# Patient Record
Sex: Male | Born: 1970
Health system: Southern US, Community
[De-identification: ages and names within clinical notes are randomized; demographics above are authoritative.]

## PROBLEM LIST (undated history)

## (undated) DIAGNOSIS — Z8669 Personal history of other diseases of the nervous system and sense organs: Secondary | ICD-10-CM

## (undated) DIAGNOSIS — C61 Malignant neoplasm of prostate: Secondary | ICD-10-CM

## (undated) DIAGNOSIS — F419 Anxiety disorder, unspecified: Secondary | ICD-10-CM

## (undated) DIAGNOSIS — H52209 Unspecified astigmatism, unspecified eye: Secondary | ICD-10-CM

## (undated) HISTORY — PX: FOREIGN BODY REMOVAL: SHX962

## (undated) HISTORY — PX: VASECTOMY: SHX75

## (undated) HISTORY — PX: KNEE ARTHROSCOPY: SUR90

## (undated) HISTORY — PX: HERNIA REPAIR: SHX51

---

## 2005-11-26 ENCOUNTER — Emergency Department (HOSPITAL_COMMUNITY): Admission: EM | Admit: 2005-11-26 | Discharge: 2005-11-26 | Payer: Self-pay | Admitting: Emergency Medicine

## 2009-11-17 ENCOUNTER — Emergency Department (HOSPITAL_COMMUNITY): Admission: EM | Admit: 2009-11-17 | Discharge: 2009-11-17 | Payer: Self-pay | Admitting: Emergency Medicine

## 2011-06-18 ENCOUNTER — Telehealth: Payer: Self-pay | Admitting: Cardiology

## 2011-06-18 NOTE — Telephone Encounter (Signed)
Error

## 2013-09-15 ENCOUNTER — Emergency Department (HOSPITAL_COMMUNITY): Payer: BC Managed Care – PPO

## 2013-09-15 ENCOUNTER — Encounter (HOSPITAL_COMMUNITY): Payer: Self-pay | Admitting: Emergency Medicine

## 2013-09-15 ENCOUNTER — Emergency Department (HOSPITAL_COMMUNITY)
Admission: EM | Admit: 2013-09-15 | Discharge: 2013-09-15 | Disposition: A | Discharge status: Home / Self Care | Payer: BC Managed Care – PPO | Attending: Emergency Medicine | Admitting: Emergency Medicine

## 2013-09-15 DIAGNOSIS — Y9389 Activity, other specified: Secondary | ICD-10-CM | POA: Insufficient documentation

## 2013-09-15 DIAGNOSIS — Y929 Unspecified place or not applicable: Secondary | ICD-10-CM | POA: Insufficient documentation

## 2013-09-15 DIAGNOSIS — S91331A Puncture wound without foreign body, right foot, initial encounter: Secondary | ICD-10-CM

## 2013-09-15 DIAGNOSIS — W3400XA Accidental discharge from unspecified firearms or gun, initial encounter: Secondary | ICD-10-CM

## 2013-09-15 DIAGNOSIS — Z23 Encounter for immunization: Secondary | ICD-10-CM | POA: Insufficient documentation

## 2013-09-15 DIAGNOSIS — W34010A Accidental discharge of airgun, initial encounter: Secondary | ICD-10-CM | POA: Insufficient documentation

## 2013-09-15 DIAGNOSIS — M795 Residual foreign body in soft tissue: Secondary | ICD-10-CM

## 2013-09-15 DIAGNOSIS — S91309A Unspecified open wound, unspecified foot, initial encounter: Secondary | ICD-10-CM | POA: Insufficient documentation

## 2013-09-15 MED ORDER — HYDROCODONE-ACETAMINOPHEN 5-325 MG PO TABS: 1.0000 | ORAL_TABLET | ORAL | Status: DC | PRN

## 2013-09-15 MED ORDER — TETANUS-DIPHTH-ACELL PERTUSSIS 5-2.5-18.5 LF-MCG/0.5 IM SUSP
0.5000 mL | Freq: Once | INTRAMUSCULAR | Status: AC
  Administered 2013-09-15: 0.5 mL via INTRAMUSCULAR
  Filled 2013-09-15: qty 0.5

## 2013-09-15 MED ORDER — CEPHALEXIN 500 MG PO CAPS: 500.0000 mg | ORAL_CAPSULE | Freq: Four times a day (QID) | ORAL | Status: DC

## 2013-09-15 MED ORDER — ONDANSETRON 4 MG PO TBDP
4.0000 mg | ORAL_TABLET | Freq: Once | ORAL | Status: AC
  Administered 2013-09-15: 4 mg via ORAL
  Filled 2013-09-15: qty 1
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
## 2013-09-15 NOTE — ED Notes (Signed)
Ortho tech called for application of ACE wrap and post op shoe.

## 2013-09-15 NOTE — ED Notes (Signed)
Pt ambulatory to exam room with steady gait.  
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
## 2013-09-15 NOTE — ED Provider Notes (Signed)
Medical screening examination/treatment/procedure(s) were performed by non-physician practitioner and as supervising physician I was immediately available for consultation/collaboration.  EKG Interpretation   None         Mckinsley Koelzer, MD 09/15/13 1929 

## 2018-05-05 ENCOUNTER — Other Ambulatory Visit: Payer: Self-pay | Admitting: Occupational Medicine

## 2018-05-05 ENCOUNTER — Ambulatory Visit: Payer: Self-pay

## 2018-05-05 DIAGNOSIS — S51832A Puncture wound without foreign body of left forearm, initial encounter: Secondary | ICD-10-CM

## 2019-01-13 DIAGNOSIS — Z0000 Encounter for general adult medical examination without abnormal findings: Secondary | ICD-10-CM | POA: Diagnosis not present

## 2019-01-13 DIAGNOSIS — Z125 Encounter for screening for malignant neoplasm of prostate: Secondary | ICD-10-CM | POA: Diagnosis not present

## 2019-01-13 DIAGNOSIS — Z131 Encounter for screening for diabetes mellitus: Secondary | ICD-10-CM | POA: Diagnosis not present

## 2019-03-22 ENCOUNTER — Other Ambulatory Visit: Payer: Self-pay | Admitting: Urology
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
## 2019-04-13 ENCOUNTER — Other Ambulatory Visit: Payer: Self-pay

## 2019-04-13 ENCOUNTER — Encounter (HOSPITAL_COMMUNITY): Payer: Self-pay

## 2019-04-13 ENCOUNTER — Encounter (HOSPITAL_COMMUNITY)
Admission: RE | Admit: 2019-04-13 | Discharge: 2019-04-13 | Disposition: A | Discharge status: Home / Self Care | Payer: 59 | Source: Ambulatory Visit | Attending: Urology | Admitting: Urology

## 2019-04-13 DIAGNOSIS — Z20828 Contact with and (suspected) exposure to other viral communicable diseases: Secondary | ICD-10-CM | POA: Diagnosis not present

## 2019-04-13 DIAGNOSIS — Z01812 Encounter for preprocedural laboratory examination: Secondary | ICD-10-CM | POA: Diagnosis not present

## 2019-04-13 HISTORY — DX: Personal history of other diseases of the nervous system and sense organs: Z86.69

## 2019-04-13 HISTORY — DX: Anxiety disorder, unspecified: F41.9

## 2019-04-13 HISTORY — DX: Unspecified astigmatism, unspecified eye: H52.209

## 2019-04-13 HISTORY — DX: Malignant neoplasm of prostate: C61

## 2019-04-13 LAB — CBC
HCT: 40.4 % (ref 39.0–52.0)
Hemoglobin: 13.2 g/dL (ref 13.0–17.0)
MCH: 29.3 pg (ref 26.0–34.0)
MCHC: 32.7 g/dL (ref 30.0–36.0)
MCV: 89.8 fL (ref 80.0–100.0)
Platelets: 208 10*3/uL (ref 150–400)
RBC: 4.5 MIL/uL (ref 4.22–5.81)
RDW: 13.9 % (ref 11.5–15.5)
WBC: 4.6 10*3/uL (ref 4.0–10.5)
nRBC: 0 % (ref 0.0–0.2)

## 2019-04-13 LAB — BASIC METABOLIC PANEL
Anion gap: 6 (ref 5–15)
BUN: 12 mg/dL (ref 6–20)
CO2: 28 mmol/L (ref 22–32)
Calcium: 9.2 mg/dL (ref 8.9–10.3)
Chloride: 105 mmol/L (ref 98–111)
Creatinine, Ser: 1.2 mg/dL (ref 0.61–1.24)
GFR calc Af Amer: >60 mL/min (ref >60)
GFR calc non Af Amer: >60 mL/min (ref >60)
Glucose, Bld: 103 mg/dL — ABNORMAL HIGH (ref 70–99)
Potassium: 3.8 mmol/L (ref 3.5–5.1)
Sodium: 139 mmol/L (ref 135–145)

## 2019-04-13 LAB — PROTIME-INR
INR: 0.9 (ref 0.8–1.2)
Prothrombin Time: 12.3 seconds (ref 11.4–15.2)

## 2019-04-13 NOTE — Patient Instructions (Addendum)
YOU NEED TO HAVE A COVID 19 TEST ON_______ Thursday July 30th @_3 :00pm______, THIS TEST MUST BE DONE BEFORE SURGERY, COM[address]  [address], [place] , [address]. ONCE YOUR COVID TEST IS COMPLETED, PLEASE BEGIN THE QUARANTINE INSTRUCTIONS AS OUTLINED IN YOUR HANDOUT.                 Bradley Lowe      Your procedure is scheduled on: 04-19-2019   Report to [facility] Main  Entrance    Report to [place] at 5:30AM   1 VISITOR IS ALLOWED TO WAIT IN WAITING ROOM  ONLY DAY OF YOUR SURGERY.    Call this number if you have problems the morning of surgery [phone]    Remember: Do not eat food or drink liquids :After Midnight. BRUSH YOUR TEETH MORNING OF SURGERY AND RINSE YOUR MOUTH OUT, NO CHEWING GUM CANDY OR MINTS.     Take these medicines the morning of surgery with A SIP OF WATER: TYLENOL IF NEEDED, GABAPENTIN IF NEEDED, HYDROXYZINE IF NEEDED               You may not have any metal on your body including hair pins and              piercings  Do not wear jewelry, make-up, lotions, powders or perfumes, deodorant             Do not wear nail polish.  Do not shave  48 hours prior to surgery.              Men may shave face and neck.   Do not bring valuables to the hospital. [place].  Contacts, dentures or bridgework may not be worn into surgery.                    Please read over the following fact sheets you were given: _____________________________________________________________________             [facility] - Preparing for Surgery Before surgery, you can play an important role.  Because skin is not sterile, your skin needs to be as free of germs as possible.  You can reduce the number of germs on your skin by washing with CHG (chlorahexidine gluconate) soap before surgery.  CHG is an antiseptic cleaner which kills germs and bonds with the skin to continue killing germs even after washing. Please DO  NOT use if you have an allergy to CHG or antibacterial soaps.  If your skin becomes reddened/irritated stop using the CHG and inform your nurse when you arrive at Short Stay. Do not shave (including legs and underarms) for at least 48 hours prior to the first CHG shower.  You may shave your face/neck. Please follow these instructions carefully:  1.  Shower with CHG Soap the night before surgery and the  morning of Surgery.  2.  If you choose to wash your hair, wash your hair first as usual with your  normal  shampoo.  3.  After you shampoo, rinse your hair and body thoroughly to remove the  shampoo.                           4.  Use CHG as you would any other liquid soap.  You can apply chg directly  to the  skin and wash                       Gently with a scrungie or clean washcloth.  5.  Apply the CHG Soap to your body ONLY FROM THE NECK DOWN.   Do not use on face/ open                           Wound or open sores. Avoid contact with eyes, ears mouth and genitals (private parts).                       Wash face,  Genitals (private parts) with your normal soap.             6.  Wash thoroughly, paying special attention to the area where your surgery  will be performed.  7.  Thoroughly rinse your body with warm water from the neck down.  8.  DO NOT shower/wash with your normal soap after using and rinsing off  the CHG Soap.                9.  Pat yourself dry with a clean towel.            10.  Wear clean pajamas.            11.  Place clean sheets on your bed the night of your first shower and do not  sleep with pets. Day of Surgery : Do not apply any lotions/deodorants the morning of surgery.  Please wear clean clothes to the hospital/surgery center.  FAILURE TO FOLLOW THESE INSTRUCTIONS MAY RESULT IN THE CANCELLATION OF YOUR SURGERY PATIENT SIGNATURE_________________________________  NURSE  SIGNATURE__________________________________  ________________________________________________________________________   Bradley Lowe  An incentive spirometer is a tool that can help keep your lungs clear and active. This tool measures how well you are filling your lungs with each breath. Taking long deep breaths may help reverse or decrease the chance of developing breathing (pulmonary) problems (especially infection) following:  A long period of time when you are unable to move or be active. BEFORE THE PROCEDURE   If the spirometer includes an indicator to show your best effort, your nurse or respiratory therapist will set it to a desired goal.  If possible, sit up straight or lean slightly forward. Try not to slouch.  Hold the incentive spirometer in an upright position. INSTRUCTIONS FOR USE  1. Sit on the edge of your bed if possible, or sit up as far as you can in bed or on a chair. 2. Hold the incentive spirometer in an upright position. 3. Breathe out normally. 4. Place the mouthpiece in your mouth and seal your lips tightly around it. 5. Breathe in slowly and as deeply as possible, raising the piston or the ball toward the top of the column. 6. Hold your breath for 3-5 seconds or for as long as possible. Allow the piston or ball to fall to the bottom of the column. 7. Remove the mouthpiece from your mouth and breathe out normally. 8. Rest for a few seconds and repeat Steps 1 through 7 at least 10 times every 1-2 hours when you are awake. Take your time and take a few normal breaths between deep breaths. 9. The spirometer may include an indicator to show your best effort. Use the indicator as a goal to work toward during each repetition. 10. After each set of  10 deep breaths, practice coughing to be sure your lungs are clear. If you have an incision (the cut made at the time of surgery), support your incision when coughing by placing a pillow or rolled up towels firmly  against it. Once you are able to get out of bed, walk around indoors and cough well. You may stop using the incentive spirometer when instructed by your caregiver.  RISKS AND COMPLICATIONS  Take your time so you do not get dizzy or light-headed.  If you are in pain, you may need to take or ask for pain medication before doing incentive spirometry. It is harder to take a deep breath if you are having pain. AFTER USE  Rest and breathe slowly and easily.  It can be helpful to keep track of a log of your progress. Your caregiver can provide you with a simple table to help with this. If you are using the spirometer at home, follow these instructions: [place] IF:   You are having difficultly using the spirometer.  You have trouble using the spirometer as often as instructed.  Your pain medication is not giving enough relief while using the spirometer.  You develop fever of 100.5 F (38.1 C) or higher. SEEK IMMEDIATE MEDICAL CARE IF:   You cough up bloody sputum that had not been present before.  You develop fever of 102 F (38.9 C) or greater.  You develop worsening pain at or near the incision site. MAKE SURE YOU:   Understand these instructions.  Will watch your condition.  Will get help right away if you are not doing well or get worse. Document Released: 01/13/2007 Document Revised: 11/25/2011 Document Reviewed: 03/16/2007 Exit[employer] Patient Information 2014 ExitCare, [place].   ________________________________________________________________________  WHAT IS A BLOOD TRANSFUSION? Blood Transfusion Information  A transfusion is the replacement of blood or some of its parts. Blood is made up of multiple cells which provide different functions.  Red blood cells carry oxygen and are used for blood loss replacement.  White blood cells fight against infection.  Platelets control bleeding.  Plasma helps clot blood.  Other blood products are available for  specialized needs, such as hemophilia or other clotting disorders. BEFORE THE TRANSFUSION  Who gives blood for transfusions?   Healthy volunteers who are fully evaluated to make sure their blood is safe. This is blood bank blood. Transfusion therapy is the safest it has ever been in the practice of medicine. Before blood is taken from a donor, a complete history is taken to make sure that person has no history of diseases nor engages in risky social behavior (examples are intravenous drug use or sexual activity with multiple partners). The donor's travel history is screened to minimize risk of transmitting infections, such as malaria. The donated blood is tested for signs of infectious diseases, such as HIV and hepatitis. The blood is then tested to be sure it is compatible with you in order to minimize the chance of a transfusion reaction. If you or a relative donates blood, this is often done in anticipation of surgery and is not appropriate for emergency situations. It takes many days to process the donated blood. RISKS AND COMPLICATIONS Although transfusion therapy is very safe and saves many lives, the main dangers of transfusion include:   Getting an infectious disease.  Developing a transfusion reaction. This is an allergic reaction to something in the blood you were given. Every precaution is taken to prevent this. The decision to have a blood  transfusion has been considered carefully by your caregiver before blood is given. Blood is not given unless the benefits outweigh the risks. AFTER THE TRANSFUSION  Right after receiving a blood transfusion, you will usually feel much better and more energetic. This is especially true if your red blood cells have gotten low (anemic). The transfusion raises the level of the red blood cells which carry oxygen, and this usually causes an energy increase.  The nurse administering the transfusion will monitor you carefully for complications. HOME CARE  INSTRUCTIONS  No special instructions are needed after a transfusion. You may find your energy is better. Speak with your caregiver about any limitations on activity for underlying diseases you may have. SEEK MEDICAL CARE IF:   Your condition is not improving after your transfusion.  You develop redness or irritation at the intravenous (IV) site. SEEK IMMEDIATE MEDICAL CARE IF:  Any of the following symptoms occur over the next 12 hours:  Shaking chills.  You have a temperature by mouth above 102 F (38.9 C), not controlled by medicine.  Chest, back, or muscle pain.  People around you feel you are not acting correctly or are confused.  Shortness of breath or difficulty breathing.  Dizziness and fainting.  You get a rash or develop hives.  You have a decrease in urine output.  Your urine turns a dark color or changes to pink, red, or brown. Any of the following symptoms occur over the next 10 days:  You have a temperature by mouth above 102 F (38.9 C), not controlled by medicine.  Shortness of breath.  Weakness after normal activity.  The white part of the eye turns yellow (jaundice).  You have a decrease in the amount of urine or are urinating less often.  Your urine turns a dark color or changes to pink, red, or brown. Document Released: 08/30/2000 Document Revised: 11/25/2011 Document Reviewed: 04/18/2008 [facility] Patient Information 2014 [place], [place].  _______________________________________________________________________

## 2019-04-14 LAB — ABO/RH: ABO/RH(D): O POS

## 2019-04-15 ENCOUNTER — Other Ambulatory Visit (HOSPITAL_COMMUNITY)
Admission: RE | Admit: 2019-04-15 | Discharge: 2019-04-15 | Disposition: A | Discharge status: Home / Self Care | Payer: 59 | Source: Ambulatory Visit | Attending: Urology | Admitting: Urology

## 2019-04-15 DIAGNOSIS — Z01812 Encounter for preprocedural laboratory examination: Secondary | ICD-10-CM | POA: Diagnosis not present

## 2019-04-16 LAB — SARS CORONAVIRUS 2 (TAT 6-24 HRS): SARS Coronavirus 2: NEGATIVE

## 2019-04-19 ENCOUNTER — Other Ambulatory Visit: Payer: Self-pay

## 2019-04-19 ENCOUNTER — Observation Stay (HOSPITAL_COMMUNITY)
Admission: RE | Admit: 2019-04-19 | Discharge: 2019-04-20 | Disposition: A | Discharge status: Home / Self Care | Payer: 59 | Source: Ambulatory Visit | Attending: Urology | Admitting: Urology

## 2019-04-19 ENCOUNTER — Encounter (HOSPITAL_COMMUNITY): Payer: Self-pay | Admitting: *Deleted

## 2019-04-19 ENCOUNTER — Ambulatory Visit (HOSPITAL_COMMUNITY): Payer: 59 | Admitting: Anesthesiology

## 2019-04-19 ENCOUNTER — Ambulatory Visit (HOSPITAL_COMMUNITY): Payer: 59 | Admitting: Physician Assistant

## 2019-04-19 ENCOUNTER — Encounter (HOSPITAL_COMMUNITY)
Admission: RE | Disposition: A | Discharge status: Home / Self Care | Payer: Self-pay | Source: Ambulatory Visit | Attending: Urology

## 2019-04-19 DIAGNOSIS — G4730 Sleep apnea, unspecified: Secondary | ICD-10-CM | POA: Diagnosis not present

## 2019-04-19 DIAGNOSIS — F419 Anxiety disorder, unspecified: Secondary | ICD-10-CM | POA: Insufficient documentation

## 2019-04-19 DIAGNOSIS — Z79899 Other long term (current) drug therapy: Secondary | ICD-10-CM | POA: Diagnosis not present

## 2019-04-19 DIAGNOSIS — C61 Malignant neoplasm of prostate: Principal | ICD-10-CM | POA: Diagnosis present

## 2019-04-19 DIAGNOSIS — Z791 Long term (current) use of non-steroidal anti-inflammatories (NSAID): Secondary | ICD-10-CM | POA: Insufficient documentation

## 2019-04-19 HISTORY — PX: PELVIC LYMPH NODE DISSECTION: SHX6543

## 2019-04-19 HISTORY — PX: ROBOT ASSISTED LAPAROSCOPIC RADICAL PROSTATECTOMY: SHX5141

## 2019-04-19 LAB — HEMOGLOBIN AND HEMATOCRIT, BLOOD
HCT: 39 % (ref 39.0–52.0)
Hemoglobin: 13.4 g/dL (ref 13.0–17.0)

## 2019-04-19 LAB — TYPE AND SCREEN
ABO/RH(D): O POS
Antibody Screen: NEGATIVE

## 2019-04-19 SURGERY — PROSTATECTOMY, RADICAL, ROBOT-ASSISTED, LAPAROSCOPIC
Anesthesia: General | Site: Abdomen

## 2019-04-19 MED ORDER — GABAPENTIN 100 MG PO CAPS: 100.0000 mg | ORAL_CAPSULE | Freq: Every day | ORAL | Status: DC | PRN

## 2019-04-19 MED ORDER — ROCURONIUM BROMIDE 10 MG/ML (PF) SYRINGE
PREFILLED_SYRINGE | INTRAVENOUS | Status: AC
  Filled 2019-04-19: qty 10

## 2019-04-19 MED ORDER — FENTANYL CITRATE (PF) 250 MCG/5ML IJ SOLN
INTRAMUSCULAR | Status: AC
  Filled 2019-04-19: qty 5

## 2019-04-19 MED ORDER — MIDAZOLAM HCL 5 MG/5ML IJ SOLN
INTRAMUSCULAR | Status: DC | PRN
  Administered 2019-04-19: 2 mg via INTRAVENOUS

## 2019-04-19 MED ORDER — ONDANSETRON HCL 4 MG/2ML IJ SOLN: 4.0000 mg | INTRAMUSCULAR | Status: DC | PRN

## 2019-04-19 MED ORDER — MIDAZOLAM HCL 2 MG/2ML IJ SOLN
INTRAMUSCULAR | Status: AC
  Filled 2019-04-19: qty 2

## 2019-04-19 MED ORDER — DIPHENHYDRAMINE HCL 12.5 MG/5ML PO ELIX: 12.5000 mg | ORAL_SOLUTION | Freq: Four times a day (QID) | ORAL | Status: DC | PRN

## 2019-04-19 MED ORDER — HYDROMORPHONE HCL 1 MG/ML IJ SOLN
0.2500 mg | INTRAMUSCULAR | Status: DC | PRN
  Administered 2019-04-19 (×4): 0.5 mg via INTRAVENOUS

## 2019-04-19 MED ORDER — ACETAMINOPHEN 325 MG PO TABS
650.0000 mg | ORAL_TABLET | ORAL | Status: DC | PRN
  Administered 2019-04-20 (×2): 650 mg via ORAL
  Filled 2019-04-19 (×2): qty 2

## 2019-04-19 MED ORDER — LIDOCAINE 2% (20 MG/ML) 5 ML SYRINGE
INTRAMUSCULAR | Status: DC | PRN
  Administered 2019-04-19: 100 mg via INTRAVENOUS

## 2019-04-19 MED ORDER — SODIUM CHLORIDE (PF) 0.9 % IJ SOLN
INTRAMUSCULAR | Status: DC | PRN
  Administered 2019-04-19: 20 mL

## 2019-04-19 MED ORDER — DOCUSATE SODIUM 100 MG PO CAPS
100.0000 mg | ORAL_CAPSULE | Freq: Two times a day (BID) | ORAL | Status: DC
  Administered 2019-04-19 – 2019-04-20 (×2): 100 mg via ORAL
  Filled 2019-04-19 (×2): qty 1

## 2019-04-19 MED ORDER — HYDROMORPHONE HCL 1 MG/ML IJ SOLN
INTRAMUSCULAR | Status: AC
  Filled 2019-04-19: qty 1

## 2019-04-19 MED ORDER — SUCCINYLCHOLINE CHLORIDE 200 MG/10ML IV SOSY
PREFILLED_SYRINGE | INTRAVENOUS | Status: AC
  Filled 2019-04-19: qty 10

## 2019-04-19 MED ORDER — HYDROCODONE-ACETAMINOPHEN 5-325 MG PO TABS: 1.0000 | ORAL_TABLET | Freq: Four times a day (QID) | ORAL | 0 refills | Status: DC | PRN

## 2019-04-19 MED ORDER — DEXAMETHASONE SODIUM PHOSPHATE 10 MG/ML IJ SOLN
INTRAMUSCULAR | Status: AC
  Filled 2019-04-19: qty 1

## 2019-04-19 MED ORDER — LACTATED RINGERS IR SOLN
Status: DC | PRN
  Administered 2019-04-19: 1000 mL

## 2019-04-19 MED ORDER — DEXAMETHASONE SODIUM PHOSPHATE 10 MG/ML IJ SOLN
INTRAMUSCULAR | Status: DC | PRN
  Administered 2019-04-19: 5 mg via INTRAVENOUS

## 2019-04-19 MED ORDER — ONDANSETRON HCL 4 MG/2ML IJ SOLN
INTRAMUSCULAR | Status: DC | PRN
  Administered 2019-04-19: 4 mg via INTRAVENOUS

## 2019-04-19 MED ORDER — SODIUM CHLORIDE (PF) 0.9 % IJ SOLN
INTRAMUSCULAR | Status: AC
  Filled 2019-04-19: qty 20

## 2019-04-19 MED ORDER — FENTANYL CITRATE (PF) 250 MCG/5ML IJ SOLN
INTRAMUSCULAR | Status: DC | PRN
  Administered 2019-04-19 (×2): 50 ug via INTRAVENOUS
  Administered 2019-04-19: 100 ug via INTRAVENOUS
  Administered 2019-04-19 (×3): 50 ug via INTRAVENOUS

## 2019-04-19 MED ORDER — SUCCINYLCHOLINE CHLORIDE 200 MG/10ML IV SOSY
PREFILLED_SYRINGE | INTRAVENOUS | Status: DC | PRN
  Administered 2019-04-19: 120 mg via INTRAVENOUS

## 2019-04-19 MED ORDER — SULFAMETHOXAZOLE-TRIMETHOPRIM 800-160 MG PO TABS: 1.0000 | ORAL_TABLET | Freq: Two times a day (BID) | ORAL | 0 refills | Status: DC

## 2019-04-19 MED ORDER — BACITRACIN-NEOMYCIN-POLYMYXIN 400-5-5000 EX OINT: 1.0000 "application " | TOPICAL_OINTMENT | Freq: Three times a day (TID) | CUTANEOUS | Status: DC | PRN

## 2019-04-19 MED ORDER — LIDOCAINE 2% (20 MG/ML) 5 ML SYRINGE
INTRAMUSCULAR | Status: AC
  Filled 2019-04-19: qty 5

## 2019-04-19 MED ORDER — PHENYLEPHRINE 40 MCG/ML (10ML) SYRINGE FOR IV PUSH (FOR BLOOD PRESSURE SUPPORT)
PREFILLED_SYRINGE | INTRAVENOUS | Status: AC
  Filled 2019-04-19: qty 10

## 2019-04-19 MED ORDER — FENTANYL CITRATE (PF) 100 MCG/2ML IJ SOLN
INTRAMUSCULAR | Status: AC
  Filled 2019-04-19: qty 2

## 2019-04-19 MED ORDER — HYDROMORPHONE HCL 1 MG/ML IJ SOLN
INTRAMUSCULAR | Status: AC
  Administered 2019-04-19: 0.5 mg via INTRAVENOUS
  Filled 2019-04-19: qty 1

## 2019-04-19 MED ORDER — ONDANSETRON HCL 4 MG/2ML IJ SOLN
INTRAMUSCULAR | Status: AC
  Filled 2019-04-19: qty 2

## 2019-04-19 MED ORDER — PROMETHAZINE HCL 25 MG/ML IJ SOLN: 6.2500 mg | INTRAMUSCULAR | Status: DC | PRN

## 2019-04-19 MED ORDER — EPHEDRINE 5 MG/ML INJ
INTRAVENOUS | Status: AC
  Filled 2019-04-19: qty 10

## 2019-04-19 MED ORDER — PROPOFOL 10 MG/ML IV BOLUS
INTRAVENOUS | Status: DC | PRN
  Administered 2019-04-19: 160 mg via INTRAVENOUS

## 2019-04-19 MED ORDER — HYDROMORPHONE HCL 1 MG/ML IJ SOLN
0.5000 mg | INTRAMUSCULAR | Status: DC | PRN
  Administered 2019-04-19 (×2): 1 mg via INTRAVENOUS
  Filled 2019-04-19: qty 1

## 2019-04-19 MED ORDER — SUGAMMADEX SODIUM 200 MG/2ML IV SOLN
INTRAVENOUS | Status: DC | PRN
  Administered 2019-04-19: 200 mg via INTRAVENOUS

## 2019-04-19 MED ORDER — HYDROXYZINE HCL 10 MG PO TABS: 10.0000 mg | ORAL_TABLET | Freq: Every day | ORAL | Status: DC | PRN

## 2019-04-19 MED ORDER — DIPHENHYDRAMINE HCL 50 MG/ML IJ SOLN: 12.5000 mg | Freq: Four times a day (QID) | INTRAMUSCULAR | Status: DC | PRN

## 2019-04-19 MED ORDER — OXYBUTYNIN CHLORIDE 5 MG PO TABS: 5.0000 mg | ORAL_TABLET | Freq: Three times a day (TID) | ORAL | Status: DC | PRN

## 2019-04-19 MED ORDER — DIAZEPAM 5 MG PO TABS: 5.0000 mg | ORAL_TABLET | Freq: Three times a day (TID) | ORAL | Status: DC | PRN

## 2019-04-19 MED ORDER — ROCURONIUM BROMIDE 10 MG/ML (PF) SYRINGE
PREFILLED_SYRINGE | INTRAVENOUS | Status: DC | PRN
  Administered 2019-04-19: 20 mg via INTRAVENOUS
  Administered 2019-04-19: 60 mg via INTRAVENOUS
  Administered 2019-04-19 (×4): 20 mg via INTRAVENOUS

## 2019-04-19 MED ORDER — SODIUM CHLORIDE 0.9 % IV BOLUS
1000.0000 mL | Freq: Once | INTRAVENOUS | Status: AC
  Administered 2019-04-19: 1000 mL via INTRAVENOUS

## 2019-04-19 MED ORDER — PROPOFOL 10 MG/ML IV BOLUS
INTRAVENOUS | Status: AC
  Filled 2019-04-19: qty 20

## 2019-04-19 MED ORDER — PHENYLEPHRINE 40 MCG/ML (10ML) SYRINGE FOR IV PUSH (FOR BLOOD PRESSURE SUPPORT)
PREFILLED_SYRINGE | INTRAVENOUS | Status: DC | PRN
  Administered 2019-04-19 (×2): 80 ug via INTRAVENOUS

## 2019-04-19 MED ORDER — BELLADONNA ALKALOIDS-OPIUM 16.2-60 MG RE SUPP
1.0000 | Freq: Four times a day (QID) | RECTAL | Status: DC | PRN
  Administered 2019-04-19: 1 via RECTAL

## 2019-04-19 MED ORDER — HYDROMORPHONE HCL 1 MG/ML IJ SOLN
INTRAMUSCULAR | Status: AC
  Administered 2019-04-19: 1 mg via INTRAVENOUS
  Filled 2019-04-19: qty 1

## 2019-04-19 MED ORDER — DEXTROSE-NACL 5-0.45 % IV SOLN
INTRAVENOUS | Status: DC
  Administered 2019-04-19 – 2019-04-20 (×2): via INTRAVENOUS

## 2019-04-19 MED ORDER — LACTATED RINGERS IV SOLN
INTRAVENOUS | Status: DC
  Administered 2019-04-19 (×2): via INTRAVENOUS

## 2019-04-19 MED ORDER — HYDROCODONE-ACETAMINOPHEN 5-325 MG PO TABS
1.0000 | ORAL_TABLET | ORAL | Status: DC | PRN
  Administered 2019-04-19 (×2): 2 via ORAL
  Filled 2019-04-19 (×2): qty 2

## 2019-04-19 MED ORDER — CEFAZOLIN SODIUM-DEXTROSE 2-4 GM/100ML-% IV SOLN
2.0000 g | INTRAVENOUS | Status: AC
  Administered 2019-04-19: 2 g via INTRAVENOUS
  Filled 2019-04-19: qty 100

## 2019-04-19 MED ORDER — BELLADONNA ALKALOIDS-OPIUM 16.2-60 MG RE SUPP
RECTAL | Status: AC
  Administered 2019-04-19: 1 via RECTAL
  Filled 2019-04-19: qty 1

## 2019-04-19 MED ORDER — BUPIVACAINE LIPOSOME 1.3 % IJ SUSP
20.0000 mL | Freq: Once | INTRAMUSCULAR | Status: AC
  Administered 2019-04-19: 20 mL
  Filled 2019-04-19: qty 20

## 2019-04-19 SURGICAL SUPPLY — 67 items
ADH SKN CLS APL DERMABOND .7 (GAUZE/BANDAGES/DRESSINGS) ×2
AGENT HMST KT MTR STRL THRMB (HEMOSTASIS) ×1
APL PRP STRL LF DISP 70% ISPRP (MISCELLANEOUS) ×2
APL SWBSTK 6 STRL LF DISP (MISCELLANEOUS) ×2
APPLICATOR COTTON TIP 6 STRL (MISCELLANEOUS) ×2 IMPLANT
APPLICATOR COTTON TIP 6IN STRL (MISCELLANEOUS) ×4
APPLICATOR SURGIFLO ENDO (HEMOSTASIS) ×2 IMPLANT
CATH FOLEY 2WAY SLVR  5CC 18FR (CATHETERS) ×2
CATH FOLEY 2WAY SLVR 5CC 18FR (CATHETERS) ×2 IMPLANT
CATH TIEMANN FOLEY 18FR 5CC (CATHETERS) ×4 IMPLANT
CHLORAPREP W/TINT 26 (MISCELLANEOUS) ×4 IMPLANT
CLIP VESOLOCK LG 6/CT PURPLE (CLIP) ×8 IMPLANT
COVER SURGICAL LIGHT HANDLE (MISCELLANEOUS) ×4 IMPLANT
COVER TIP SHEARS 8 DVNC (MISCELLANEOUS) ×2 IMPLANT
COVER TIP SHEARS 8MM DA VINCI (MISCELLANEOUS) ×2
COVER WAND RF STERILE (DRAPES) IMPLANT
CUTTER ECHEON FLEX ENDO 45 340 (ENDOMECHANICALS) ×4 IMPLANT
DECANTER SPIKE VIAL GLASS SM (MISCELLANEOUS) ×4 IMPLANT
DERMABOND ADVANCED (GAUZE/BANDAGES/DRESSINGS) ×2
DERMABOND ADVANCED .7 DNX12 (GAUZE/BANDAGES/DRESSINGS) IMPLANT
DRAPE ARM DVNC X/XI (DISPOSABLE) ×8 IMPLANT
DRAPE COLUMN DVNC XI (DISPOSABLE) ×2 IMPLANT
DRAPE DA VINCI XI ARM (DISPOSABLE) ×8
DRAPE DA VINCI XI COLUMN (DISPOSABLE) ×2
DRAPE SURG IRRIG POUCH 19X23 (DRAPES) ×4 IMPLANT
DRSG TEGADERM 4X4.75 (GAUZE/BANDAGES/DRESSINGS) ×4 IMPLANT
ELECT PENCIL ROCKER SW 15FT (MISCELLANEOUS) ×2 IMPLANT
ELECT REM PT RETURN 15FT ADLT (MISCELLANEOUS) ×4 IMPLANT
GAUZE SPONGE 2X2 8PLY STRL LF (GAUZE/BANDAGES/DRESSINGS) IMPLANT
GLOVE BIO SURGEON STRL SZ 6.5 (GLOVE) ×3 IMPLANT
GLOVE BIO SURGEON STRL SZ7.5 (GLOVE) ×8 IMPLANT
GLOVE BIO SURGEONS STRL SZ 6.5 (GLOVE) ×1
GOWN STRL REUS W/TWL LRG LVL3 (GOWN DISPOSABLE) ×4 IMPLANT
GOWN STRL REUS W/TWL XL LVL3 (GOWN DISPOSABLE) ×8 IMPLANT
HEMOSTAT SURGICEL 4X8 (HEMOSTASIS) ×2 IMPLANT
HOLDER FOLEY CATH W/STRAP (MISCELLANEOUS) ×4 IMPLANT
IRRIG SUCT STRYKERFLOW 2 WTIP (MISCELLANEOUS) ×4
IRRIGATION SUCT STRKRFLW 2 WTP (MISCELLANEOUS) ×2 IMPLANT
IV LACTATED RINGERS 1000ML (IV SOLUTION) ×4 IMPLANT
KIT TURNOVER KIT A (KITS) IMPLANT
NDL INSUFFLATION 14GA 120MM (NEEDLE) ×2 IMPLANT
NEEDLE INSUFFLATION 14GA 120MM (NEEDLE) ×4 IMPLANT
OBTURATOR OPTICAL STANDARD 8MM (TROCAR) ×2
OBTURATOR OPTICAL STND 8 DVNC (TROCAR) ×2
OBTURATOR OPTICALSTD 8 DVNC (TROCAR) IMPLANT
PACK ROBOT UROLOGY CUSTOM (CUSTOM PROCEDURE TRAY) ×4 IMPLANT
PAD POSITIONING PINK XL (MISCELLANEOUS) ×4 IMPLANT
RELOAD STAPLE 45 4.1 GRN THCK (STAPLE) ×2 IMPLANT
SEAL CANN UNIV 5-8 DVNC XI (MISCELLANEOUS) ×8 IMPLANT
SEAL XI 5MM-8MM UNIVERSAL (MISCELLANEOUS) ×8
SET TUBE SMOKE EVAC HIGH FLOW (TUBING) ×2 IMPLANT
SOLUTION ELECTROLUBE (MISCELLANEOUS) ×4 IMPLANT
SPONGE GAUZE 2X2 STER 10/PKG (GAUZE/BANDAGES/DRESSINGS) ×2
SPONGE LAP 4X18 RFD (DISPOSABLE) ×4 IMPLANT
STAPLE RELOAD 45 GRN (STAPLE) ×2 IMPLANT
STAPLE RELOAD 45MM GREEN (STAPLE) ×3
SURGIFLO W/THROMBIN 8M KIT (HEMOSTASIS) ×2 IMPLANT
SUT ETHILON 3 0 PS 1 (SUTURE) ×4 IMPLANT
SUT MNCRL AB 4-0 PS2 18 (SUTURE) ×8 IMPLANT
SUT VIC AB 0 UR5 27 (SUTURE) ×4 IMPLANT
SUT VIC AB 2-0 SH 27 (SUTURE) ×3
SUT VIC AB 2-0 SH 27X BRD (SUTURE) ×2 IMPLANT
SUT VICRYL 0 UR6 27IN ABS (SUTURE) ×8 IMPLANT
SUT VLOC BARB 180 ABS3/0GR12 (SUTURE) ×8
SUTURE VLOC BRB 180 ABS3/0GR12 (SUTURE) ×4 IMPLANT
TOWEL OR NON WOVEN STRL DISP B (DISPOSABLE) ×4 IMPLANT
WATER STERILE IRR 1000ML POUR (IV SOLUTION) ×4 IMPLANT

## 2019-04-19 NOTE — Op Note (Signed)
Operative Note  Preoperative diagnosis:  1.  Prostate cancer   Postoperative diagnosis: 1.  Prostate cancer  Procedure(s): 1.  Robotic assisted laparoscopic prostatectomy with bilateral pelvic lymph node dissection  Surgeon: Link Snuffer, MD  Assistants: Estill Bamberg dancy--an assistant was needed due to the nature of the case being a robotic surgery requiring a bedside assistant for retraction, suctioning, exchanging instruments, etc.   Resident: Tharon Aquas, MD  Anesthesia: General   Complications: None   EBL: 100 cc   Specimens: 1. prostate and seminal vesicles 2.  Periprosthetic fat 3.  Right pelvic lymph node packet 4.  Left pelvic lymph node packet  Drains/Catheters: 1. 59 French Foley catheter and JP drain   Intraoperative findings: prostate and seminal vesicles were removed en bloc Without any evidence of gross extraprostatic disease  Indication: 48 year old male recently diagnosed with Gleason 3+4 adenocarcinoma the prostate elected to undergo the above operation.  Description of procedure:  The patient was identified and consent was obtained.  The patient was taken to the operating room and placed in the supine position.  The patient was placed under general anesthesia.  Perioperative antibiotics were administered.  The patient was placed in dorsal lithotomy.  Patient was prepped and draped in a standard sterile fashion and a timeout was performed.  The patient was placed in steep Trendelenburg.  Foley catheter was placed.  Veress needle was inserted supraumbilical and the drop test was performed with no evidence of any injury.  The abdomen was then insufflated to a pressure of 15.  4 robotic working ports, a 12 mm assistant port, and a 5 mm assistant port were placed under direct visualization in a standard fashion for a robotic pelvic surgery.  The abdomen was inspected and there was no evidence of any visceral or vascular injury.  I first released colonic adhesions  in the left lower quadrant.  I then retracted the sigmoid and rectum superiorly.  I first started with the posterior dissection by incising along the posterior peritoneum and identifying bilateral vas deferens.  These were divided and bilateral seminal vesicles were carefully dissected out.  Denonvier fascia was then entered posteriorly.  The bladder was then dropped by incising along the medial umbilical ligament bilaterally.  Periprosthetic fat was cleared off the prostate and passed off and passed off for specimen.  The endopelvic fascia was incised bilaterally and the puboprostatic ligaments were released.  A stapler with a vascular staple load was used to staple the dorsal venous complex.  I then incised along into the bladder neck and divided the bladder neck.  The catheter was brought out and used for retraction.  I divided the remainder of the bladder neck and then pulled the seminal vesicles out of this incision.  Careful dissection was performed and bilateral prostatic pedicles were released using Hem-o-lok clips and sharp dissection.  Spot cautery was sparingly used.  Periprosthetic tissue was carefully released inferiorly and laterally, performing a nerve sparing operation bilaterally.  Once only the urethra remained attached, the anterior portion of the urethra was divided.  A 2-0 Vicryl stitch was placed at the 6 o'clock position.  The remainder of the urethra was divided and the prostate was placed in a specimen bag.  FloSeal was applied to the prostatectomy bed.  I then performed the bilateral pelvic lymph node dissection.  I first carefully removed lymph node packet from the right side overlying the external iliac artery and vein down to the level of the obturator nerve.  This was  passed off for specimen and then I carefully removed the left sided pelvic lymph node packets again overlying the external iliac artery and vein down to the level of the obturator nerve taking care not to injure any  vital structures.  The 2-0 Vicryl stitch was used to reapproximate the bladder and urethra at the 6 o'clock position.  A running 3-0 V lock suture was then used to reapproximate the bladder and urethra.  The bladder was filled with normal saline and there was no evidence of any leak at the anastomosis.  The anastomosis was watertight.  The Foley catheter was exchanged for a fresh catheter.  A JP drain was inserted from the left lateral port site.  This was secured down with a nylon stitch.  Surgicel was placed in the prostatectomy bed.  The robot was undocked and the patient was taken out of Trendelenburg.  All working ports were removed under direct visualization with the camera to ensure there was no bleeding from the sites.  The midline port incision was extended and the prostate extracted in the specimen bag.  Interrupted figure-of-eight 0 Vicryl sutures were used to close  the fascia.  The 12 mm assistant port fascia was also closed with a 2-0 Vicryl.  Skin was then closed with 4-0 Monocryl and Dermabond.  Exparel was used for anesthetic effect.  This concluded the operation.  The patient tolerated procedure well and was stable postoperatively.  Plan: Will obtain stat labs.  He will remain in the hospital overnight and hopefully be able to be discharged tomorrow.  He will keep his catheter for 7-10 days.

## 2019-04-19 NOTE — Anesthesia Postprocedure Evaluation (Signed)
Anesthesia Post Note  Patient: Bradley Lowe  Procedure(Lowe) Performed: XI ROBOTIC ASSISTED LAPAROSCOPIC RADICAL PROSTATECTOMY (N/A Abdomen) PELVIC LYMPH NODE DISSECTION (Bilateral Abdomen)     Patient location during evaluation: PACU Anesthesia Type: General Level of consciousness: awake and alert Pain management: pain level controlled Vital Signs Assessment: post-procedure vital signs reviewed and stable Respiratory status: spontaneous breathing, nonlabored ventilation, respiratory function stable and patient connected to nasal cannula oxygen Cardiovascular status: blood pressure returned to baseline and stable Postop Assessment: no apparent nausea or vomiting Anesthetic complications: no    Last Vitals:  Vitals:   04/19/19 1445 04/19/19 1532  BP: (!) 143/81 (!) 154/76  Pulse: 80 83  Resp: 19 16  Temp: 36.7 C (!) 36.4 C  SpO2: 100% 100%    Last Pain:  Vitals:   04/19/19 1540  TempSrc:   PainSc: 9                  Bradley Lowe

## 2019-04-19 NOTE — Anesthesia Preprocedure Evaluation (Signed)
Anesthesia Evaluation  Patient identified by MRN, date of birth, ID band Patient awake    Reviewed: Allergy & Precautions, NPO status , Patient's Chart, lab work & pertinent test results  Airway Mallampati: II  TM Distance: >3 FB Neck ROM: Full    Dental no notable dental hx.    Pulmonary neg pulmonary ROS,    Pulmonary exam normal breath sounds clear to auscultation       Cardiovascular negative cardio ROS Normal cardiovascular exam Rhythm:Regular Rate:Normal     Neuro/Psych negative neurological ROS  negative psych ROS   GI/Hepatic negative GI ROS, Neg liver ROS,   Endo/Other  negative endocrine ROS  Renal/GU negative Renal ROS  negative genitourinary   Musculoskeletal negative musculoskeletal ROS (+)   Abdominal   Peds negative pediatric ROS (+)  Hematology negative hematology ROS (+)   Anesthesia Other Findings   Reproductive/Obstetrics negative OB ROS                             Anesthesia Physical Anesthesia Plan  ASA: II  Anesthesia Plan: General   Post-op Pain Management:    Induction: Intravenous  PONV Risk Score and Plan: 2 and Ondansetron, Dexamethasone and Treatment may vary due to age or medical condition  Airway Management Planned: Oral ETT  Additional Equipment:   Intra-op Plan:   Post-operative Plan: Extubation in OR  Informed Consent: I have reviewed the patients History and Physical, chart, labs and discussed the procedure including the risks, benefits and alternatives for the proposed anesthesia with the patient or authorized representative who has indicated his/her understanding and acceptance.   Dental advisory given  Plan Discussed with: CRNA and Surgeon  Anesthesia Plan Comments:         Anesthesia Quick Evaluation  

## 2019-04-19 NOTE — Progress Notes (Signed)
Assumed care of pt from 330-7p, Pt stable. Cont. With plan of care. IS used q1h. Awaiting arrival of SCDs. Foley with clear yellow urine. JP charged and intact. Incision lines without compromise noted.

## 2019-04-19 NOTE — Anesthesia Procedure Notes (Signed)
Procedure Name: Intubation Date/Time: 04/19/2019 7:51 AM Performed by: Myna Bright, CRNA Pre-anesthesia Checklist: Emergency Drugs available, Suction available, Patient identified and Patient being monitored Patient Re-evaluated:Patient Re-evaluated prior to induction Oxygen Delivery Method: Circle system utilized Preoxygenation: Pre-oxygenation with 100% oxygen Induction Type: IV induction Ventilation: Mask ventilation without difficulty Laryngoscope Size: Mac and 4 Grade View: Grade I Tube type: Oral Tube size: 7.5 mm Number of attempts: 1 Airway Equipment and Method: Stylet Placement Confirmation: ETT inserted through vocal cords under direct vision,  positive ETCO2 and breath sounds checked- equal and bilateral Secured at: 23 cm Tube secured with: Tape Dental Injury: Teeth and Oropharynx as per pre-operative assessment

## 2019-04-19 NOTE — Transfer of Care (Signed)
Immediate Anesthesia Transfer of Care Note  Patient: Bradley Lowe  Procedure(s) Performed: XI ROBOTIC ASSISTED LAPAROSCOPIC RADICAL PROSTATECTOMY (N/A Abdomen) PELVIC LYMPH NODE DISSECTION (Bilateral Abdomen)  Patient Location: PACU  Anesthesia Type:General  Level of Consciousness: awake, alert , oriented and patient cooperative  Airway & Oxygen Therapy: Patient Spontanous Breathing and Patient connected to face mask oxygen  Post-op Assessment: Report given to RN, Post -op Vital signs reviewed and stable and Patient moving all extremities  Post vital signs: Reviewed and stable  Last Vitals:  Vitals Value Taken Time  BP 158/145 04/19/19 1217  Temp    Pulse 84 04/19/19 1219  Resp 16 04/19/19 1219  SpO2 100 % 04/19/19 1219  Vitals shown include unvalidated device data.  Last Pain:  Vitals:   04/19/19 0635  TempSrc: Oral  PainSc: 0-No pain      Patients Stated Pain Goal: 3 (09/08/48 7530)  Complications: No apparent anesthesia complications

## 2019-04-19 NOTE — Discharge Instructions (Signed)

## 2019-04-19 NOTE — H&P (Signed)
CC/HPI: Cc: Elevated PSA  HPI:  48 year old male referred for elevated PSA. PSA was 5.1 on 01/14/2019. Free PSA was 11.2%. He denies history of urinary tract infection or prostatitis. He denies having irritative voiding symptoms or infection at the time of PSA measurement. He has not had a repeat value. He denies family history of prostate cancer. He has mild lower urinary tract symptoms including frequency, nocturia 2, difficulty initiating stream.   03/11/2019  Patient returns for prostate biopsy   03/18/2019  Patient is doing well since prostate biopsy. He is having some blood in his ejaculate. Otherwise, doing well. Unfortunately, prostate biopsy revealed 2 cores of adenocarcinoma the prostate, maximum Gleason score 3+4.   He has no erectile dysfunction  History of a hernia repair as a child, denies other abdominal surgeries  PSA 5.1, 4.17  Prostate size 27 g  DRE, no palpable disease     ALLERGIES: No Allergies    MEDICATIONS: Hydroxyzine Hcl  Motrin 800 mg tablet Oral  Tylenol     GU PSH: Prostate Needle Biopsy - 03/11/2019 Vasectomy - 2009       PSH Notes: Hernia Repair, Surgery Of Male Genitalia Vasectomy   NON-GU PSH: Foot surgery (unspecified), Right Hernia Repair - 2009 Knee Arthroscopy/surgery, Right Surgical Pathology, Gross And Microscopic Examination For Prostate Needle - 03/11/2019     GU PMH: BPH w/LUTS - 02/17/2019 Elevated PSA - 02/17/2019 Nocturia - 02/17/2019 Urinary Frequency - 02/17/2019 Disorder of male genital organs, unspecified, Testis mass - 2014      PMH Notes:  1898-09-16 00:00:00 - Note: Normal Routine History And Physical Adult   NON-GU PMH: Anxiety Sleep Apnea    FAMILY HISTORY: 1 Daughter - Other 1 son - Other Death In The Family Father - Runs In Family Death In The Family Mother - Runs In Family Family Health Status Number - Runs In Family heart failure - Father pancreatitis - Mother sickle cell trait - Runs in Family   SOCIAL  HISTORY: Marital Status: Single Preferred Language: English; Race: Black or African American Current Smoking Status: Patient has never smoked.   Tobacco Use Assessment Completed: Used Tobacco in last 30 days? Drinks 2 caffeinated drinks per day.     Notes: Marital History - Divorced, Tobacco Use, Caffeine Use, Alcohol Use, Occupation:   REVIEW OF SYSTEMS:    GU Review Male:   Patient denies frequent urination, hard to postpone urination, burning/ pain with urination, get up at night to urinate, leakage of urine, stream starts and stops, trouble starting your stream, have to strain to urinate , erection problems, and penile pain.  Gastrointestinal (Upper):   Patient denies nausea, vomiting, and indigestion/ heartburn.  Gastrointestinal (Lower):   Patient denies diarrhea and constipation.  Constitutional:   Patient denies fever, night sweats, weight loss, and fatigue.  Skin:   Patient denies skin rash/ lesion and itching.  Eyes:   Patient denies blurred vision and double vision.  Ears/ Nose/ Throat:   Patient denies sore throat and sinus problems.  Hematologic/Lymphatic:   Patient denies swollen glands and easy bruising.  Cardiovascular:   Patient denies leg swelling and chest pains.  Respiratory:   Patient denies shortness of breath and cough.  Endocrine:   Patient denies excessive thirst.  Musculoskeletal:   Patient denies back pain and joint pain.  Neurological:   Patient denies headaches and dizziness.  Psychologic:   Patient denies depression and anxiety.   VITAL SIGNS: None   MULTI-SYSTEM PHYSICAL EXAMINATION:  Constitutional: Well-nourished. No physical deformities. Normally developed. Good grooming.  Respiratory: No labored breathing, no use of accessory muscles.   Cardiovascular: Normal temperature, adequate perfusion of extremities  Skin: No paleness, no jaundice  Neurologic / Psychiatric: Oriented to time, oriented to place, oriented to person. No depression, no anxiety, no  agitation.  Gastrointestinal: No mass, no tenderness, no rigidity, non obese abdomen. Small laparoscopic incisions, one in the right lower quadrant, one above the umbilicus  Eyes: Normal conjunctivae. Normal eyelids.  Musculoskeletal: Normal gait and station of head and neck.     PAST DATA REVIEWED:  Source Of History:  Patient  Lab Test Review:   PSA  Records Review:   Pathology Reports, Previous Patient Records   02/17/19  PSA  Total PSA 4.17 ng/mL    PROCEDURES:          Urinalysis w/Scope Dipstick Dipstick Cont'd Micro  Color: Yellow Bilirubin: Neg mg/dL WBC/hpf: 0 - 5/hpf  Appearance: Clear Ketones: Neg mg/dL RBC/hpf: 0 - 2/hpf  Specific Gravity: 1.025 Blood: Trace ery/uL Bacteria: Rare (0-9/hpf)  pH: <=5.0 Protein: Neg mg/dL Cystals: NS (Not Seen)  Glucose: Neg mg/dL Urobilinogen: 0.2 mg/dL Casts: NS (Not Seen)    Nitrites: Neg Trichomonas: Not Present    Leukocyte Esterase: Neg leu/uL Mucous: Present      Epithelial Cells: 0 - 5/hpf      Yeast: NS (Not Seen)      Sperm: Not Present    ASSESSMENT:      ICD-10 Details  1 GU:   Prostate Cancer - C61    PLAN:           Orders Labs Urine Culture          Document Letter(s):  Created for Patient: Clinical Summary         Notes:   I went over his pathology report with him today as well as the significance of his Gleason score, number and location of cores positive and percent of cores positive.  The patient was counseled about the natural history of prostate cancer and the standard treatment options that are available for prostate cancer. It was explained to him how his age and life expectancy, clinical stage, Gleason score, and PSA affect his prognosis, the decision to proceed with additional staging studies, as well as how that information influences recommended treatment strategies. We discussed the roles for active surveillance, radiation therapy, surgical therapy, androgen deprivation, as well as ablative therapy  options for the treatment of prostate cancer as appropriate to his individual cancer situation. We discussed the risks and benefits of these options with regard to their impact on cancer control and also in terms of potential adverse events, complications, and impact on quiality of life particularly related to urinary, bowel, and sexual function. The patient was encouraged to ask questions throughout the discussion today and all questions were answered to his stated satisfaction. In addition, the patient was provided with and/or directed to appropriate resources and literature for further education about prostate cancer and treatment options.   He would like to proceed with robotic-assisted laparoscopic prostatectomy with bilateral pelvic lymph node dissection. He understands potential risks including but not limited to bleeding, infection, injury to surrounding structures such as permanent nerve injury, bowel injury, rectal injury. He understands potential long-term risk of bladder neck contracture, permanent urinary incontinence, permanent erectile dysfunction. He also understands other potential complications such as stroke, cardiac event, blood clot. He wishes to proceed. He is not interested in radiation  Cc: Dr. Beverely Risen, M.D.   Signed by Link Snuffer, III, M.D. on 03/18/19 at 4:48 PM (EDT

## 2019-04-20 ENCOUNTER — Encounter (HOSPITAL_COMMUNITY): Payer: Self-pay | Admitting: Urology

## 2019-04-20 DIAGNOSIS — C61 Malignant neoplasm of prostate: Secondary | ICD-10-CM | POA: Diagnosis not present

## 2019-04-20 LAB — BASIC METABOLIC PANEL
Anion gap: 9 (ref 5–15)
BUN: 11 mg/dL (ref 6–20)
CO2: 27 mmol/L (ref 22–32)
Calcium: 8.6 mg/dL — ABNORMAL LOW (ref 8.9–10.3)
Chloride: 102 mmol/L (ref 98–111)
Creatinine, Ser: 1.21 mg/dL (ref 0.61–1.24)
GFR calc Af Amer: >60 mL/min (ref >60)
GFR calc non Af Amer: >60 mL/min (ref >60)
Glucose, Bld: 118 mg/dL — ABNORMAL HIGH (ref 70–99)
Potassium: 3.9 mmol/L (ref 3.5–5.1)
Sodium: 138 mmol/L (ref 135–145)

## 2019-04-20 LAB — HEMOGLOBIN AND HEMATOCRIT, BLOOD
HCT: 39.8 % (ref 39.0–52.0)
Hemoglobin: 13.3 g/dL (ref 13.0–17.0)

## 2019-04-20 LAB — CREATININE, FLUID (PLEURAL, PERITONEAL, JP DRAINAGE): Creat, Fluid: 1.1 mg/dL

## 2019-04-20 MED ORDER — BISACODYL 10 MG RE SUPP
10.0000 mg | Freq: Once | RECTAL | Status: AC
  Administered 2019-04-20: 10 mg via RECTAL
  Filled 2019-04-20: qty 1

## 2019-04-20 NOTE — Progress Notes (Signed)
Pt discharged from the unit via wheelchair. Discharge instructions were reviewed with the pt at bedside. Foley care and discharge instructions were reviewed in detail JP drain d/c'd prior to discharge. No questions or concerns at this time.

## 2019-04-20 NOTE — Progress Notes (Signed)
Pt. Ambulated to the bathroom with standby assist. Pt. Attempted to ambulate in the halls afterwards with this RN, but felt lightheaded. Pt. Returned to bed. Pt. Stated that he was not used to taking narcotics and thinks it is the reason he started to feel light headed. Pt. Had 6/10 abdominal pain d/t surgery. 650 mg Tylenol PO given. Pt. Resting comfortably in bed. Will continue to monitor closely.

## 2019-04-20 NOTE — Discharge Summary (Signed)
[facility] Discharge Summary  Admit date: 04/19/2019  Discharge date and time: 04/20/19   Discharge to: Home  Discharge Service: Urology  Discharge Attending Physician:  Dr. Gloriann Loan  Discharge  Diagnoses: Prostate cancer  OR Procedures: Procedure(s): XI ROBOTIC ASSISTED [place] 04/19/2019   Ancillary Procedures: None   Discharge Day Services: The patient was seen and examined by the Urology team both in the morning and immediately prior to discharge.  Vital signs and laboratory values were stable and within normal limits.  The physical exam was benign and unchanged and all surgical wounds were examined.  Discharge instructions were explained and all questions answered. JP removed  Subjective  Tolerating clears. Pain controlled. Ambulating.  Objective Patient Vitals for the past 8 hrs:  BP Temp Temp src Pulse Resp SpO2  04/20/19 0830 124/79 98 F (36.7 C) Oral 75 16 100 %   Total I/O In: 120 [P.O.:120] Out: 950 [Urine:900; Drains:50]  General Appearance:        No acute distress Lungs:                       Normal work of breathing on room air Heart:                                Regular rate and rhythm Abdomen:                         Soft, non-tender, non-distended. Incision c/d/i with dermabond. JP SS (to be removed Extremities:                      Warm and well perfused   Hospital Course:  The patient underwent robotic prostatectomy and lymph node dissection on 04/19/2019.  The patient tolerated the procedure well, was extubated in the OR, and afterwards was taken to the PACU for routine post-surgical care. When stable the patient was transferred to the floor.     The patient did well postoperatively.  The patient's diet was slowly advanced and at the time of discharge was tolerating a regular diet (although mostly taking in clears).    The patient was discharged home 1 Day Post-Op, at which point was tolerating  a regular solid diet, was able to care for his foley, have adequate pain control with P.O. pain medication, and could ambulate without difficulty. The patient will follow up with [place] for post op check. This is already scheduled in [place].   Condition at Discharge: Improved  Discharge Medications:  Allergies as of 04/20/2019   No Known Allergies     Medication List    STOP taking these medications   acetaminophen 500 MG tablet Commonly known as: TYLENOL   ibuprofen 200 MG tablet Commonly known as: ADVIL   meloxicam 15 MG tablet Commonly known as: MOBIC     TAKE these medications   gabapentin 100 MG capsule Commonly known as: NEURONTIN Take 100 mg by mouth daily as needed (pain).   HYDROcodone-acetaminophen 5-325 MG tablet Commonly known as: Norco Take 1-2 tablets by mouth every 6 (six) hours as needed for moderate pain or severe pain.   hydrOXYzine 10 MG tablet Commonly known as: ATARAX/VISTARIL Take 10 mg by mouth daily as needed for anxiety.   sulfamethoxazole-trimethoprim 800-160 MG tablet Commonly known as: BACTRIM DS Take 1 tablet by mouth 2 (two) times  daily. Start the day prior to foley removal appointment

## 2019-04-20 NOTE — Progress Notes (Signed)
Urology Progress Note   1 Day Post-Op s/p RALP  Subjective: Dizziness overnight, pain improved. Tolerating clears. Ambulated.   Objective: Vital signs in last 24 hours: Temp:  [97.5 F (36.4 C)-98.1 F (36.7 C)] 98.1 F (36.7 C) (08/04 0458) Pulse Rate:  [71-88] 77 (08/04 0458) Resp:  [15-20] 16 (08/04 0458) BP: (110-157)/(57-103) 122/68 (08/04 0458) SpO2:  [99 %-100 %] 99 % (08/04 0458) Weight:  [82 kg] 82 kg (08/03 1532)  Intake/Output from previous day: 08/03 0701 - 08/04 0700 In: 3802.9 [P.O.:360; I.V.:2442.9; IV Piggyback:1000] Out: 2875 [Urine:2350; Drains:275; Blood:250] Intake/Output this shift: No intake/output data recorded.  Physical Exam:  General: Alert and oriented CV: normal WOB Lungs: Clear Abdomen: Soft, appropriately tender. Incision c/d/i. JP SS GU: Foley in place draining clear yellow urine  Ext: NT, No erythema  Lab Results: Recent Labs    04/19/19 1231 04/20/19 0346  HGB 13.4 13.3  HCT 39.0 39.8   BMET Recent Labs    04/20/19 0346  NA 138  K 3.9  CL 102  CO2 27  GLUCOSE 118*  BUN 11  CREATININE 1.21  CALCIUM 8.6*     Studies/Results: No results found.  Assessment/Plan:  48 y.o. male s/p RALP.  Overall doing well post-op.   - d/c fluids - suppository - reg diet - JP for Cr (>200cc output) - Pain control - Foley teaching - Likely home in PM   Dispo: Floor   LOS: 0 days   Tharon Aquas 04/20/2019, 7:44 AM

## 2019-04-20 NOTE — Plan of Care (Signed)

## 2022-08-01 ENCOUNTER — Telehealth: Payer: Self-pay | Admitting: Oncology

## 2022-08-01 NOTE — Telephone Encounter (Signed)
Spoke with patient regarding new patient appointments 12/8

## 2022-08-23 ENCOUNTER — Encounter: Payer: Self-pay | Admitting: Oncology

## 2022-08-23 ENCOUNTER — Other Ambulatory Visit: Payer: Self-pay

## 2022-08-23 ENCOUNTER — Inpatient Hospital Stay: Payer: BC Managed Care – PPO

## 2022-08-23 ENCOUNTER — Inpatient Hospital Stay: Payer: BC Managed Care – PPO | Attending: Oncology

## 2022-08-23 ENCOUNTER — Inpatient Hospital Stay (HOSPITAL_BASED_OUTPATIENT_CLINIC_OR_DEPARTMENT_OTHER): Payer: BC Managed Care – PPO | Admitting: Oncology

## 2022-08-23 VITALS — BP 121/87 | HR 74 | Temp 98.2°F | Resp 16 | Wt 185.2 lb

## 2022-08-23 DIAGNOSIS — D72810 Lymphocytopenia: Secondary | ICD-10-CM | POA: Insufficient documentation

## 2022-08-23 DIAGNOSIS — C61 Malignant neoplasm of prostate: Secondary | ICD-10-CM | POA: Diagnosis not present

## 2022-08-23 DIAGNOSIS — D709 Neutropenia, unspecified: Secondary | ICD-10-CM

## 2022-08-23 LAB — CBC WITH DIFFERENTIAL/PLATELET
Abs Immature Granulocytes: 0.01 10*3/uL (ref 0.00–0.07)
Basophils Absolute: 0 10*3/uL (ref 0.0–0.1)
Basophils Relative: 1 %
Eosinophils Absolute: 0.1 10*3/uL (ref 0.0–0.5)
Eosinophils Relative: 4 %
HCT: 40.6 % (ref 39.0–52.0)
Hemoglobin: 14.3 g/dL (ref 13.0–17.0)
Immature Granulocytes: 0 %
Lymphocytes Relative: 27 %
Lymphs Abs: 1 10*3/uL (ref 0.7–4.0)
MCH: 30.8 pg (ref 26.0–34.0)
MCHC: 35.2 g/dL (ref 30.0–36.0)
MCV: 87.3 fL (ref 80.0–100.0)
Monocytes Absolute: 0.3 10*3/uL (ref 0.1–1.0)
Monocytes Relative: 10 %
Neutro Abs: 2.1 10*3/uL (ref 1.7–7.7)
Neutrophils Relative %: 58 %
Platelets: 219 10*3/uL (ref 150–400)
RBC: 4.65 MIL/uL (ref 4.22–5.81)
RDW: 14 % (ref 11.5–15.5)
WBC: 3.6 10*3/uL — ABNORMAL LOW (ref 4.0–10.5)
nRBC: 0 % (ref 0.0–0.2)

## 2022-08-23 LAB — COMPREHENSIVE METABOLIC PANEL
ALT: 15 U/L (ref 0–44)
AST: 24 U/L (ref 15–41)
Albumin: 4.3 g/dL (ref 3.5–5.0)
Alkaline Phosphatase: 103 U/L (ref 38–126)
Anion gap: 4 — ABNORMAL LOW (ref 5–15)
BUN: 16 mg/dL (ref 6–20)
CO2: 30 mmol/L (ref 22–32)
Calcium: 9.6 mg/dL (ref 8.9–10.3)
Chloride: 105 mmol/L (ref 98–111)
Creatinine, Ser: 1.16 mg/dL (ref 0.61–1.24)
GFR, Estimated: >60 mL/min (ref >60)
Glucose, Bld: 94 mg/dL (ref 70–99)
Potassium: 4 mmol/L (ref 3.5–5.1)
Sodium: 139 mmol/L (ref 135–145)
Total Bilirubin: 0.4 mg/dL (ref 0.3–1.2)
Total Protein: 7.6 g/dL (ref 6.5–8.1)

## 2022-08-23 LAB — FOLATE: Folate: 12.4 ng/mL (ref >5.9)

## 2022-08-23 LAB — RETICULOCYTES
Immature Retic Fract: 12.5 % (ref 2.3–15.9)
RBC.: 4.66 MIL/uL (ref 4.22–5.81)
Retic Count, Absolute: 82.9 10*3/uL (ref 19.0–186.0)
Retic Ct Pct: 1.8 % (ref 0.4–3.1)

## 2022-08-23 LAB — VITAMIN B12: Vitamin B-12: 607 pg/mL (ref 180–914)

## 2022-08-23 LAB — FERRITIN: Ferritin: 20 ng/mL — ABNORMAL LOW (ref 24–336)

## 2022-08-23 NOTE — Progress Notes (Signed)
[facility] Initial Visit:  Patient Care Team: Pcp, No as PCP - General  CHIEF COMPLAINTS/PURPOSE OF CONSULTATION:  Oncology History   No history exists.    HISTORY OF PRESENTING ILLNESS: Bradley Lowe 51 y.o. male is here because of leukopenia  Medical history notable for migraines, prostate cancer treated with robotic assisted laparoscopic prostatectomy with pelvic lymph node dissection in August 2020, restless leg syndrome  May 07 2022: WBC 2.8 hemoglobin 14.2 platelet count 203 61 segs 26 lymphs 9 monos 3 eos 1 basophil.  Absolute lymphocyte count 722  July 26, 2022: WBC 3.3 hemoglobin 14.0 MCV 88 platelet count 217 ANC 1729 ALC 1049 absolute monocyte count 310.  Differential 52 segs 32 lymphs 9 monos 5 eos 1 basophil smear for morphology review demonstrated leukopenia.  Myeloid population predominantly of mature segmented neutrophils.  Rare atypical lymphs.  Few target cells noted  August 23 2022:  [facility] Drinks a lot of sodas, teas, energy drinks.  Did not receive XRT of chemotherapy or or hormone suppression for treatment of prostate cancer  Social:  Divorced.  Billboard installer.  Tobacco none.  EtOH none.  Recreational drugs none  [place] Mother died 37 pancreatitis from EtOH Father died 78 ESRD on HD, tobacco use, PVD Brother x 2.  Identical twin died from [place] Sister x 2 alive.  [place] with EtOH abuse. Other with DM Type II, HTN     Review of Systems  Constitutional:  Negative for appetite change, chills, fatigue, fever and unexpected weight change.  HENT:   Negative for mouth sores, nosebleeds, sore throat, trouble swallowing and voice change.   Eyes:  Negative for eye problems and icterus.       Vision changes:  None  Respiratory:  Negative for chest tightness, cough, hemoptysis, shortness of breath and wheezing.        PND:  none Orthopnea:  none DOE:    Cardiovascular:  Negative for chest pain, leg swelling and  palpitations.       PND:  none Orthopnea:  none  Gastrointestinal:  Negative for abdominal distention, abdominal pain, blood in stool, constipation, diarrhea, nausea and vomiting.  Endocrine: Negative for hot flashes.       Cold intolerance:  none Heat intolerance:  none  Genitourinary:  Negative for bladder incontinence, difficulty urinating, dysuria, frequency, hematuria and nocturia.   Musculoskeletal:  Negative for arthralgias, back pain, gait problem, myalgias, neck pain and neck stiffness.  Skin:  Negative for itching, rash and wound.  Neurological:  Negative for dizziness, extremity weakness, gait problem, headaches, light-headedness, numbness, seizures and speech difficulty.  Hematological:  Negative for adenopathy. Does not bruise/bleed easily.  Psychiatric/Behavioral:  Negative for sleep disturbance and suicidal ideas. The patient is not nervous/anxious.     MEDICAL HISTORY: Past Medical History:  Diagnosis Date   Anxiety    Astigmatism    Hx of migraines    last was 5-8 months ago    Prostate cancer ([place])     SURGICAL HISTORY: Past Surgical History:  Procedure Laterality Date   FOREIGN BODY REMOVAL Right    pellet removed from right foot    HERNIA REPAIR     KNEE ARTHROSCOPY Right    PELVIC LYMPH NODE DISSECTION Bilateral 04/19/2019   Procedure: PELVIC LYMPH NODE DISSECTION;  Surgeon: Lucas Mallow, MD;  Location: [facility];  Service: Urology;  Laterality: Bilateral;   ROBOT ASSISTED LAPAROSCOPIC RADICAL PROSTATECTOMY N/A 04/19/2019   Procedure: XI ROBOTIC  ASSISTED LAPAROSCOPIC RADICAL PROSTATECTOMY;  Surgeon: Lucas Mallow, MD;  Location: [facility];  Service: Urology;  Laterality: N/A;   VASECTOMY      SOCIAL HISTORY: Social History   Socioeconomic History   Marital status: Married    Spouse name: Not on file   Number of children: Not on file   Years of education: Not on file   Highest education level: Not on file  Occupational History   Not on file   Tobacco Use   Smoking status: Never   Smokeless tobacco: Never  Substance and Sexual Activity   Alcohol use: No   Drug use: No   Sexual activity: Not on file  Other Topics Concern   Not on file  Social History Narrative   Not on file   Social Determinants of Health   Financial Resource Strain: Not on file  Food Insecurity: Not on file  Transportation Needs: Not on file  Physical Activity: Not on file  Stress: Not on file  Social Connections: Not on file  Intimate Partner Violence: Not on file    FAMILY HISTORY No family history on file.  ALLERGIES:  has No Known Allergies.  MEDICATIONS:  Current Outpatient Medications  Medication Sig Dispense Refill   tadalafil (CIALIS) 5 MG tablet Take 5 mg by mouth daily as needed for erectile dysfunction.     gabapentin (NEURONTIN) 100 MG capsule Take 100 mg by mouth daily as needed (pain). (Patient not taking: Reported on 08/23/2022)     HYDROcodone-acetaminophen (NORCO) 5-325 MG tablet Take 1-2 tablets by mouth every 6 (six) hours as needed for moderate pain or severe pain. (Patient not taking: Reported on 08/23/2022) 20 tablet 0   hydrOXYzine (ATARAX/VISTARIL) 10 MG tablet Take 10 mg by mouth daily as needed for anxiety. (Patient not taking: Reported on 08/23/2022)     sulfamethoxazole-trimethoprim (BACTRIM DS) 800-160 MG tablet Take 1 tablet by mouth 2 (two) times daily. Start the day prior to foley removal appointment (Patient not taking: Reported on 08/23/2022) 6 tablet 0   No current facility-administered medications for this visit.    PHYSICAL EXAMINATION:  ECOG PERFORMANCE STATUS: 0 - Asymptomatic   Vitals:   08/23/22 1509  BP: 121/87  Pulse: 74  Resp: 16  Temp: 98.2 F (36.8 C)  SpO2: 100%    Filed Weights   08/23/22 1509  Weight: 185 lb 3.2 oz (84 kg)     Physical Exam Vitals and nursing note reviewed.  Constitutional:      Appearance: Normal appearance. He is not diaphoretic.  HENT:     Head:  Normocephalic and atraumatic.     Right Ear: External ear normal.     Left Ear: External ear normal.     Nose: Nose normal.  Eyes:     Conjunctiva/sclera: Conjunctivae normal.     Pupils: Pupils are equal, round, and reactive to light.  Cardiovascular:     Rate and Rhythm: Normal rate and regular rhythm.     Heart sounds:     No friction rub. No gallop.  Pulmonary:     Effort: Pulmonary effort is normal. No respiratory distress.     Breath sounds: Normal breath sounds. No stridor. No wheezing or rhonchi.  Abdominal:     General: Abdomen is flat. There is no distension.     Tenderness: There is no abdominal tenderness. There is no guarding or rebound.  Musculoskeletal:        General: No swelling or tenderness. Normal range  of motion.     Cervical back: Normal range of motion and neck supple. No rigidity or tenderness.     Right lower leg: No edema.     Left lower leg: No edema.  Lymphadenopathy:     Head:     Right side of head: No submental, submandibular, tonsillar, preauricular, posterior auricular or occipital adenopathy.     Left side of head: No submental, submandibular, tonsillar, preauricular, posterior auricular or occipital adenopathy.     Cervical: No cervical adenopathy.     Right cervical: No superficial, deep or posterior cervical adenopathy.    Left cervical: No superficial, deep or posterior cervical adenopathy.     Upper Body:     Right upper body: No supraclavicular or axillary adenopathy.     Left upper body: No supraclavicular or axillary adenopathy.     Lower Body: No right inguinal adenopathy. No left inguinal adenopathy.  Skin:    Coloration: Skin is not jaundiced or pale.     Findings: No bruising or erythema.  Neurological:     General: No focal deficit present.     Mental Status: He is alert and oriented to person, place, and time.     Cranial Nerves: No cranial nerve deficit.     Motor: No weakness.     Gait: Gait normal.  Psychiatric:        Mood  and Affect: Mood normal.        Behavior: Behavior normal.        Thought Content: Thought content normal.        Judgment: Judgment normal.     LABORATORY DATA: I have personally reviewed the data as listed:  Appointment on 08/23/2022  Component Date Value Ref Range Status   Sodium 08/23/2022 139  135 - 145 mmol/L Final   Potassium 08/23/2022 4.0  3.5 - 5.1 mmol/L Final   Chloride 08/23/2022 105  98 - 111 mmol/L Final   CO2 08/23/2022 30  22 - 32 mmol/L Final   Glucose, Bld 08/23/2022 94  70 - 99 mg/dL Final   Glucose reference range applies only to samples taken after fasting for at least 8 hours.   BUN 08/23/2022 16  6 - 20 mg/dL Final   Creatinine, Ser 08/23/2022 1.16  0.61 - 1.24 mg/dL Final   Calcium 08/23/2022 9.6  8.9 - 10.3 mg/dL Final   Total Protein 08/23/2022 7.6  6.5 - 8.1 g/dL Final   Albumin 08/23/2022 4.3  3.5 - 5.0 g/dL Final   AST 08/23/2022 24  15 - 41 U/L Final   ALT 08/23/2022 15  0 - 44 U/L Final   Alkaline Phosphatase 08/23/2022 103  38 - 126 U/L Final   Total Bilirubin 08/23/2022 0.4  0.3 - 1.2 mg/dL Final   GFR, Estimated 08/23/2022 >60  >60 mL/min Final   Comment: (NOTE) Calculated using the CKD-EPI Creatinine Equation (2021)    Anion gap 08/23/2022 4 (L)  5 - 15 Final   Performed at [facility], [place] [address] [place], [place] [address]   WBC 08/23/2022 3.6 (L)  4.0 - 10.5 K/uL Final   RBC 08/23/2022 4.65  4.22 - 5.81 MIL/uL Final   Hemoglobin 08/23/2022 14.3  13.0 - 17.0 g/dL Final   HCT 08/23/2022 40.6  39.0 - 52.0 % Final   MCV 08/23/2022 87.3  80.0 - 100.0 fL Final   MCH 08/23/2022 30.8  26.0 - 34.0 pg Final   MCHC 08/23/2022 35.2  30.0 - 36.0 g/dL Final   RDW 08/23/2022 14.0  11.5 - 15.5 % Final   Platelets 08/23/2022 219  150 - 400 K/uL Final   nRBC 08/23/2022 0.0  0.0 - 0.2 % Final   Neutrophils Relative % 08/23/2022 58  % Final   Neutro Abs 08/23/2022 2.1  1.7 - 7.7 K/uL Final   Lymphocytes Relative 08/23/2022  27  % Final   Lymphs Abs 08/23/2022 1.0  0.7 - 4.0 K/uL Final   Monocytes Relative 08/23/2022 10  % Final   Monocytes Absolute 08/23/2022 0.3  0.1 - 1.0 K/uL Final   Eosinophils Relative 08/23/2022 4  % Final   Eosinophils Absolute 08/23/2022 0.1  0.0 - 0.5 K/uL Final   Basophils Relative 08/23/2022 1  % Final   Basophils Absolute 08/23/2022 0.0  0.0 - 0.1 K/uL Final   Immature Granulocytes 08/23/2022 0  % Final   Abs Immature Granulocytes 08/23/2022 0.01  0.00 - 0.07 K/uL Final   Performed at [facility], [place] [address], [place], [place] [address]   Ferritin 08/23/2022 20 (L)  24 - 336 ng/mL Final   Performed at [facility], [place] [address], [place], [place] [address]   Folate 08/23/2022 12.4  >5.9 ng/mL Final   Performed at [facility], [place] [address], [place], [place] [address]   Vitamin B-12 08/23/2022 607  180 - 914 pg/mL Final   Comment: (NOTE) This assay is not validated for testing neonatal or myeloproliferative syndrome specimens for Vitamin B12 levels. Performed at [facility], [place] [address], [place], [place] [address]    Retic Ct Pct 08/23/2022 1.8  0.4 - 3.1 % Final   RBC. 08/23/2022 4.66  4.22 - 5.81 MIL/uL Final   Retic Count, Absolute 08/23/2022 82.9  19.0 - 186.0 K/uL Final   Immature Retic Fract 08/23/2022 12.5  2.3 - 15.9 % Final   Performed at [facility], [place] [address] [place], [place] [address]    RADIOGRAPHIC STUDIES: I have personally reviewed the radiological images as listed and agree with the findings in the report  No results found.  ASSESSMENT/PLAN  50 year old male with history of prostate cancer noted to have mild granulocytopenia and lymphocytopenia  Lymphocytopenia Definition:  For adults ALC < 1000 Possible causes in this patient.   INFECTION Viral (eg, HIV, HTLV , hepatitis, )   Bacterial (eg, tuberculosis, typhoid fever,  brucellosis, Erlichiosis)  Fungal (eg, histoplasmosis)  Parasitic (eg, malaria)   CONGENITAL IMMUNODEFICIENCY DISORDERS DiGeorge anomaly Wiskott-Aldrich syndrome Severe combined immunodeficiency syndrome Ataxia-telangiectasia.    SYSTEMIC DISEASE Autoimmune disorders (eg, systemic lupus erythematosus, rheumatoid arthritis, Sjgren syndrome)  Lymphoma (Hodgkin and Non Hodgkin) Other malignancies  Sarcoidosis   Cushing's syndrome Diabetes   OTHER CAUSES Alcohol abuse  Zinc deficiency  Malnutrition, stress, exercise, trauma  Protein-losing enteropathy  Idiopathic CD4+ lymphocytopenia   Initial Evaluation:  CBC with diff, CMP, LDHBlood smear.  SPEP with IEP, free light chains, serum immunoglobulin levels including IgE /HIV, Hepatitis serologies,  angiotensin level, Cortisol, ANA/ ds DNA/ RF/ ANCA/ Sjogrens.  Zinc Level.  Will consider ACE level and lymphocyte subsets depending on results of these investigations  Granulocytopenia:   Possible causes in this patient  Neutropenia Causes Causes Examples   Congentital Benign Ethnic Pure white cell aplasia Storage Disease ELA2 mutation ELANE related neutropenia Kostman  Shawachman-Diamond Syndrome Barth Syndrome Severe congenital neutropenia X-linked agammaglobinemia Storage diseases GATA2 deficiency   Infectious  Viral Hepatitis A, B, C CMV, EBV, HIV Parvovirus   Bacterial  Tuberculosis Burcellosis [place] Typhoid   Rikettsial [place] Spotted fever Erlichiosis   Fungal Histoplasmosis  Autoimmune Primary Autoimmune    Thymoma SLE, RA Felty's syndrome Crohn's Disease Evans Syndrome   Nutritional Deficiencies B12 Folate Copper Protein calorie malnutrition   Hematologic Malignancies T-cell LGL Myeloid neoplasms PNH   Medications Non-chemotherapy   Bone marrow failure Aplastic anemia PNH    Initial evaluation:  CBC with diff, CMP, Morphology smear, ANA, anti-ds DNA, RF, B12, folate, copper, ESR.   Hepatitis ABC, HIV.  Consider flow for lymphoma including LGL leukemia.  Quantiferon Gold.  PCR for Parvo-B19, Serologies for Brucellosis, RMSF, Erlichiosis, Histoplasmosis, Typhus, Histoplasmosis.     History of prostate cancer:  Treated with prostatectomy.  Obtain PSA.  Refer to genetics because he developed it at a young age.      Cancer Staging  No matching staging information was found for the patient.   No problem-specific Assessment & Plan notes found for this encounter.   Orders Placed This Encounter  Procedures   Prostate-Specific AG, Serum   Ambulatory referral to Genetics    Referral Priority:   Routine    Referral Type:   Consultation    Referral Reason:   Specialty Services Required    Number of Visits Requested:   1    All questions were answered. The patient knows to call the clinic with any problems, questions or concerns.  This note was electronically signed.    Barbee Cough, MD  08/23/2022 4:43 PM

## 2022-08-24 LAB — ANA COMPREHENSIVE PANEL
Anti JO-1: <0.2 AI (ref 0.0–0.9)
Centromere Ab Screen: <0.2 AI (ref 0.0–0.9)
Chromatin Ab SerPl-aCnc: <0.2 AI (ref 0.0–0.9)
ENA SM Ab Ser-aCnc: <0.2 AI (ref 0.0–0.9)
Ribonucleic Protein: <0.2 AI (ref 0.0–0.9)
SSA (Ro) (ENA) Antibody, IgG: <0.2 AI (ref 0.0–0.9)
SSB (La) (ENA) Antibody, IgG: 0.4 AI (ref 0.0–0.9)
Scleroderma (Scl-70) (ENA) Antibody, IgG: <0.2 AI (ref 0.0–0.9)
ds DNA Ab: <1 IU/mL (ref 0–9)

## 2022-08-24 LAB — HEPATITIS PANEL, ACUTE
HCV Ab: NONREACTIVE
Hep A IgM: NONREACTIVE
Hep B C IgM: NONREACTIVE
Hepatitis B Surface Ag: NONREACTIVE

## 2022-08-24 LAB — HIV ANTIBODY (ROUTINE TESTING W REFLEX): HIV Screen 4th Generation wRfx: NONREACTIVE

## 2022-08-24 LAB — PROSTATE-SPECIFIC AG, SERUM (LABCORP): Prostate Specific Ag, Serum: <0.1 ng/mL (ref 0.0–4.0)

## 2022-08-25 LAB — RHEUMATOID FACTOR: Rheumatoid fact SerPl-aCnc: <10 IU/mL (ref <14.0)

## 2022-08-26 ENCOUNTER — Telehealth: Payer: Self-pay | Admitting: Oncology

## 2022-08-26 LAB — KAPPA/LAMBDA LIGHT CHAINS
Kappa free light chain: 30.3 mg/L — ABNORMAL HIGH (ref 3.3–19.4)
Kappa, lambda light chain ratio: 1.86 — ABNORMAL HIGH (ref 0.26–1.65)
Lambda free light chains: 16.3 mg/L (ref 5.7–26.3)

## 2022-08-26 NOTE — Telephone Encounter (Signed)
Spoke with patient confirming upcoming appointments  

## 2022-08-27 ENCOUNTER — Telehealth: Payer: Self-pay

## 2022-08-27 NOTE — Telephone Encounter (Signed)
Called spoke with patient, he has been advised that lab appointments are always scheduled with with office visit. Discussed with patient that if he needed more labs that it would be due to trying check for more in depth reasons for his current diagnosis.

## 2022-08-27 NOTE — Telephone Encounter (Signed)
-----   Message from Barbee Cough, MD sent at 08/27/2022 10:26 AM EST ----- Regarding: RE: Appointments Concerns Please explain to the patient that obtaining blood work is part of the process of diagnosing him Thanks ----- Message ----- From: Aleatha Borer, CMA Sent: 08/26/2022   4:50 PM EST To: Barbee Cough, MD Subject: FW: Appointments Concerns                       ----- Message ----- From: Bradley Lowe Sent: 08/26/2022   4:26 PM EST To: Aleatha Borer, CMA Subject: Appointments Concerns                          Patient concerned why labs are needed again for follow up appointment. Wants someone to get in touch to further explain.

## 2022-08-30 LAB — COPPER, SERUM: Copper: 106 ug/dL (ref 69–132)

## 2022-08-30 LAB — ZINC: Zinc: 81 ug/dL (ref 44–115)

## 2022-09-20 ENCOUNTER — Inpatient Hospital Stay: Payer: BC Managed Care – PPO | Attending: Oncology

## 2022-09-20 ENCOUNTER — Other Ambulatory Visit: Payer: Self-pay

## 2022-09-20 ENCOUNTER — Inpatient Hospital Stay (HOSPITAL_BASED_OUTPATIENT_CLINIC_OR_DEPARTMENT_OTHER): Payer: BC Managed Care – PPO | Admitting: Oncology

## 2022-09-20 VITALS — BP 105/65 | HR 68 | Temp 97.8°F | Resp 16 | Wt 185.8 lb

## 2022-09-20 DIAGNOSIS — D709 Neutropenia, unspecified: Secondary | ICD-10-CM

## 2022-09-20 DIAGNOSIS — C61 Malignant neoplasm of prostate: Secondary | ICD-10-CM

## 2022-09-20 DIAGNOSIS — D126 Benign neoplasm of colon, unspecified: Secondary | ICD-10-CM | POA: Diagnosis not present

## 2022-09-20 DIAGNOSIS — D539 Nutritional anemia, unspecified: Secondary | ICD-10-CM | POA: Insufficient documentation

## 2022-09-20 DIAGNOSIS — D72810 Lymphocytopenia: Secondary | ICD-10-CM

## 2022-09-20 DIAGNOSIS — Z8546 Personal history of malignant neoplasm of prostate: Secondary | ICD-10-CM | POA: Insufficient documentation

## 2022-09-20 LAB — CBC WITH DIFFERENTIAL (CANCER CENTER ONLY)
Abs Immature Granulocytes: 0.01 10*3/uL (ref 0.00–0.07)
Basophils Absolute: 0 10*3/uL (ref 0.0–0.1)
Basophils Relative: 1 %
Eosinophils Absolute: 0.1 10*3/uL (ref 0.0–0.5)
Eosinophils Relative: 2 %
HCT: 41.3 % (ref 39.0–52.0)
Hemoglobin: 14.6 g/dL (ref 13.0–17.0)
Immature Granulocytes: 0 %
Lymphocytes Relative: 32 %
Lymphs Abs: 1 10*3/uL (ref 0.7–4.0)
MCH: 30.4 pg (ref 26.0–34.0)
MCHC: 35.4 g/dL (ref 30.0–36.0)
MCV: 86 fL (ref 80.0–100.0)
Monocytes Absolute: 0.3 10*3/uL (ref 0.1–1.0)
Monocytes Relative: 11 %
Neutro Abs: 1.7 10*3/uL (ref 1.7–7.7)
Neutrophils Relative %: 54 %
Platelet Count: 204 10*3/uL (ref 150–400)
RBC: 4.8 MIL/uL (ref 4.22–5.81)
RDW: 13.5 % (ref 11.5–15.5)
WBC Count: 3.1 10*3/uL — ABNORMAL LOW (ref 4.0–10.5)
nRBC: 0 % (ref 0.0–0.2)

## 2022-09-20 LAB — FERRITIN: Ferritin: 31 ng/mL (ref 24–336)

## 2022-09-20 NOTE — Patient Instructions (Signed)
Please take a Flintstone's multivitamin with iron daily because you are iron deficient from slow blood loss from the colon

## 2022-09-20 NOTE — Progress Notes (Unsigned)
[facility] Initial Visit:  Patient Care Team: Pcp, No as PCP - General  CHIEF COMPLAINTS/PURPOSE OF CONSULTATION:  HISTORY OF PRESENTING ILLNESS: Bradley Lowe 52 y.o. male is here because of leukopenia  Medical history notable for migraines, prostate cancer treated with robotic assisted laparoscopic prostatectomy with pelvic lymph node dissection in August 2020, restless leg syndrome  May 07 2022: WBC 2.8 hemoglobin 14.2 platelet count 203 61 segs 26 lymphs 9 monos 3 eos 1 basophil.  Absolute lymphocyte count 722  July 26, 2022: WBC 3.3 hemoglobin 14.0 MCV 88 platelet count 217 ANC 1729 ALC 1049 absolute monocyte count 310.  Differential 52 segs 32 lymphs 9 monos 5 eos 1 basophil smear for morphology review demonstrated leukopenia.  Myeloid population predominantly of mature segmented neutrophils.  Rare atypical lymphs.  Few target cells noted  August 23 2022:  [facility] Consult Drinks a lot of sodas, teas, energy drinks.  Did not receive XRT of chemotherapy or or hormone suppression for treatment of prostate cancer  Social:  Divorced.  Billboard installer.  Tobacco none.  EtOH none.  Recreational drugs none  [place] Mother died 22 pancreatitis from EtOH Father died 36 ESRD on HD, tobacco use, PVD Brother x 2.  Identical twin died from [place] Sister x 2 alive.  [place] with EtOH abuse. Other with DM Type II, HTN  WBC 3.6 hemoglobin 14.3 MCV 87 platelet count 219; 58 segs 27 lymphs 10 monos 4 eos 1 basophil. Ferritin 20 folate 12.4 Copper 106 B12 607 zinc 81  Serum free kappa 30.3 lambda 16.3 with a kappa lambda 1.86 ANA panel negative rheumatoid factor negative PSA undetectable Hepatitis ABC serologies negative HIV negative CMP normal  September 20 2022:  Scheduled follow up Underwent colonoscopy a few days ago and was found to have several polyps which were removed To see genetic counselor next month.   Asymptomatic with iron deficiency.     Review of Systems  Constitutional:  Negative for appetite change, chills, fatigue, fever and unexpected weight change.  HENT:   Negative for mouth sores, nosebleeds, sore throat, trouble swallowing and voice change.   Eyes:  Negative for eye problems and icterus.       Vision changes:  None  Respiratory:  Negative for chest tightness, cough, hemoptysis, shortness of breath and wheezing.        PND:  none Orthopnea:  none DOE:    Cardiovascular:  Negative for chest pain, leg swelling and palpitations.       PND:  none Orthopnea:  none  Gastrointestinal:  Negative for abdominal distention, abdominal pain, blood in stool, constipation, diarrhea, nausea and vomiting.  Endocrine: Negative for hot flashes.       Cold intolerance:  none Heat intolerance:  none  Genitourinary:  Negative for bladder incontinence, difficulty urinating, dysuria, frequency, hematuria and nocturia.   Musculoskeletal:  Negative for arthralgias, back pain, gait problem, myalgias, neck pain and neck stiffness.  Skin:  Negative for itching, rash and wound.  Neurological:  Negative for dizziness, extremity weakness, gait problem, headaches, light-headedness, numbness, seizures and speech difficulty.  Hematological:  Negative for adenopathy. Does not bruise/bleed easily.  Psychiatric/Behavioral:  Negative for sleep disturbance and suicidal ideas. The patient is not nervous/anxious.     MEDICAL HISTORY: Past Medical History:  Diagnosis Date   Anxiety    Astigmatism    Hx of migraines    last was 5-8 months ago    Prostate cancer ([place])  SURGICAL HISTORY: Past Surgical History:  Procedure Laterality Date   FOREIGN BODY REMOVAL Right    pellet removed from right foot    HERNIA REPAIR     KNEE ARTHROSCOPY Right    PELVIC LYMPH NODE DISSECTION Bilateral 04/19/2019   Procedure: PELVIC LYMPH NODE DISSECTION;  Surgeon: Lucas Mallow, MD;  Location: [facility];  Service: Urology;  Laterality: Bilateral;   ROBOT  ASSISTED LAPAROSCOPIC RADICAL PROSTATECTOMY N/A 04/19/2019   Procedure: XI ROBOTIC ASSISTED LAPAROSCOPIC RADICAL PROSTATECTOMY;  Surgeon: Lucas Mallow, MD;  Location: [facility];  Service: Urology;  Laterality: N/A;   VASECTOMY      SOCIAL HISTORY: Social History   Socioeconomic History   Marital status: Married    Spouse name: Not on file   Number of children: Not on file   Years of education: Not on file   Highest education level: Not on file  Occupational History   Not on file  Tobacco Use   Smoking status: Never   Smokeless tobacco: Never  Substance and Sexual Activity   Alcohol use: No   Drug use: No   Sexual activity: Not on file  Other Topics Concern   Not on file  Social History Narrative   Not on file   Social Determinants of Health   Financial Resource Strain: Not on file  Food Insecurity: Not on file  Transportation Needs: Not on file  Physical Activity: Not on file  Stress: Not on file  Social Connections: Not on file  Intimate Partner Violence: Not on file    FAMILY HISTORY No family history on file.  ALLERGIES:  has No Known Allergies.  MEDICATIONS:  Current Outpatient Medications  Medication Sig Dispense Refill   gabapentin (NEURONTIN) 100 MG capsule Take 100 mg by mouth daily as needed (pain). (Patient not taking: Reported on 08/23/2022)     HYDROcodone-acetaminophen (NORCO) 5-325 MG tablet Take 1-2 tablets by mouth every 6 (six) hours as needed for moderate pain or severe pain. (Patient not taking: Reported on 08/23/2022) 20 tablet 0   hydrOXYzine (ATARAX/VISTARIL) 10 MG tablet Take 10 mg by mouth daily as needed for anxiety. (Patient not taking: Reported on 08/23/2022)     sulfamethoxazole-trimethoprim (BACTRIM DS) 800-160 MG tablet Take 1 tablet by mouth 2 (two) times daily. Start the day prior to foley removal appointment (Patient not taking: Reported on 08/23/2022) 6 tablet 0   tadalafil (CIALIS) 5 MG tablet Take 5 mg by mouth daily as needed for  erectile dysfunction.     No current facility-administered medications for this visit.    PHYSICAL EXAMINATION:  ECOG PERFORMANCE STATUS: 0 - Asymptomatic   There were no vitals filed for this visit.   There were no vitals filed for this visit.    Physical Exam Vitals and nursing note reviewed.  Constitutional:      Appearance: Normal appearance. He is not diaphoretic.  HENT:     Head: Normocephalic and atraumatic.     Right Ear: External ear normal.     Left Ear: External ear normal.     Nose: Nose normal.  Eyes:     Conjunctiva/sclera: Conjunctivae normal.     Pupils: Pupils are equal, round, and reactive to light.  Cardiovascular:     Rate and Rhythm: Normal rate and regular rhythm.     Heart sounds:     No friction rub. No gallop.  Pulmonary:     Effort: Pulmonary effort is normal. No respiratory distress.  Breath sounds: Normal breath sounds. No stridor. No wheezing or rhonchi.  Abdominal:     General: Abdomen is flat. There is no distension.     Tenderness: There is no abdominal tenderness. There is no guarding or rebound.  Musculoskeletal:        General: No swelling or tenderness. Normal range of motion.     Cervical back: Normal range of motion and neck supple. No rigidity or tenderness.     Right lower leg: No edema.     Left lower leg: No edema.  Lymphadenopathy:     Head:     Right side of head: No submental, submandibular, tonsillar, preauricular, posterior auricular or occipital adenopathy.     Left side of head: No submental, submandibular, tonsillar, preauricular, posterior auricular or occipital adenopathy.     Cervical: No cervical adenopathy.     Right cervical: No superficial, deep or posterior cervical adenopathy.    Left cervical: No superficial, deep or posterior cervical adenopathy.     Upper Body:     Right upper body: No supraclavicular or axillary adenopathy.     Left upper body: No supraclavicular or axillary adenopathy.     Lower  Body: No right inguinal adenopathy. No left inguinal adenopathy.  Skin:    Coloration: Skin is not jaundiced or pale.     Findings: No bruising or erythema.  Neurological:     General: No focal deficit present.     Mental Status: He is alert and oriented to person, place, and time.     Cranial Nerves: No cranial nerve deficit.     Motor: No weakness.     Gait: Gait normal.  Psychiatric:        Mood and Affect: Mood normal.        Behavior: Behavior normal.        Thought Content: Thought content normal.        Judgment: Judgment normal.     LABORATORY DATA: I have personally reviewed the data as listed:  Orders Only on 08/23/2022  Component Date Value Ref Range Status   ds DNA Ab 08/23/2022 <1  0 - 9 IU/mL Final   Comment: (NOTE)                                   Negative      <5                                   Equivocal  5 - 9                                   Positive      >9    Ribonucleic Protein 08/23/2022 <0.2  0.0 - 0.9 AI Final   ENA SM Ab Ser-aCnc 08/23/2022 <0.2  0.0 - 0.9 AI Final   Scleroderma (Scl-70) (ENA) Antibod* 08/23/2022 <0.2  0.0 - 0.9 AI Final   SSA (Ro) (ENA) Antibody, IgG 08/23/2022 <0.2  0.0 - 0.9 AI Final   SSB (La) (ENA) Antibody, IgG 08/23/2022 0.4  0.0 - 0.9 AI Final   Chromatin Ab SerPl-aCnc 08/23/2022 <0.2  0.0 - 0.9 AI Final   Anti JO-1 08/23/2022 <0.2  0.0 - 0.9 AI Final   Centromere Ab Screen 08/23/2022 <0.2  0.0 - 0.9 AI Final   See below: 08/23/2022 Comment   Final   Comment: (NOTE) Autoantibody                       Disease Association ------------------------------------------------------------                        Condition                  Frequency ---------------------   ------------------------   --------- Antinuclear Antibody,    SLE, mixed connective Direct (ANA-D)           tissue diseases ---------------------   ------------------------   --------- dsDNA                    SLE                        40 -  60% ---------------------   ------------------------   --------- Chromatin                Drug induced SLE                90%                         SLE                        48 - 97% ---------------------   ------------------------   --------- SSA (Ro)                 SLE                        25 - 35%                         Sjogren's Syndrome         40 - 70%                         Neonatal Lupus                 100% ---------------------   ------------------------   --------- SSB (La)                 SLE                                                       10%                         Sjogren's Syndrome              30% ---------------------   -----------------------    --------- Sm (anti-Smith)          SLE                        15 - 30% ---------------------   -----------------------    --------- RNP                      Mixed Connective Tissue  Disease                         95% (U1 nRNP,                SLE                        30 - 50% anti-ribonucleoprotein)  Polymyositis and/or                         Dermatomyositis                 20% ---------------------   ------------------------   --------- Scl-70 (antiDNA          Scleroderma (diffuse)      20 - 35% topoisomerase)           Crest                           13% ---------------------   ------------------------   --------- Jo-1                     Polymyositis and/or                         Dermatomyositis            20 - 40% ---------------------   ------------------------   --------- Centromere B             Scleroderma -                           Crest                         variant                         80% Performed At: [facility] [place], [place] [id number] Rush Farmer MD [id number]   Office Visit on 08/23/2022  Component Date Value Ref Range Status   Prostate Specific Ag, Serum 08/23/2022 <0.1  0.0 - 4.0 ng/mL Final   Comment: (NOTE) **Verified  by repeat analysis** Roche ECLIA methodology. According to the American Urological Association, Serum PSA should decrease and remain at undetectable levels after radical prostatectomy. The AUA defines biochemical recurrence as an initial PSA value 0.2 ng/mL or greater followed by a subsequent confirmatory PSA value 0.2 ng/mL or greater. Values obtained with different assay methods or kits cannot be used interchangeably. Results cannot be interpreted as absolute evidence of the presence or absence of malignant disease. Performed At: [facility] [place], [place] [id number] Rush Farmer MD [id number]   Appointment on 08/23/2022  Component Date Value Ref Range Status   Sodium 08/23/2022 139  135 - 145 mmol/L Final   Potassium 08/23/2022 4.0  3.5 - 5.1 mmol/L Final   Chloride 08/23/2022 105  98 - 111 mmol/L Final   CO2 08/23/2022 30  22 - 32 mmol/L Final   Glucose, Bld 08/23/2022 94  70 - 99 mg/dL Final   Glucose reference range applies only to samples taken after fasting for at least 8 hours.   BUN 08/23/2022 16  6 - 20 mg/dL Final   Creatinine, Ser 08/23/2022 1.16  0.61 - 1.24 mg/dL Final  Calcium 08/23/2022 9.6  8.9 - 10.3 mg/dL Final   Total Protein 08/23/2022 7.6  6.5 - 8.1 g/dL Final   Albumin 08/23/2022 4.3  3.5 - 5.0 g/dL Final   AST 08/23/2022 24  15 - 41 U/L Final   ALT 08/23/2022 15  0 - 44 U/L Final   Alkaline Phosphatase 08/23/2022 103  38 - 126 U/L Final   Total Bilirubin 08/23/2022 0.4  0.3 - 1.2 mg/dL Final   GFR, Estimated 08/23/2022 >60  >60 mL/min Final   Comment: (NOTE) Calculated using the CKD-EPI Creatinine Equation (2021)    Anion gap 08/23/2022 4 (L)  5 - 15 Final   Performed at [facility], [place] [address] [place], [place] [address]   WBC 08/23/2022 3.6 (L)  4.0 - 10.5 K/uL Final   RBC 08/23/2022 4.65  4.22 - 5.81 MIL/uL Final   Hemoglobin 08/23/2022 14.3  13.0 - 17.0 g/dL Final   HCT 08/23/2022 40.6   39.0 - 52.0 % Final   MCV 08/23/2022 87.3  80.0 - 100.0 fL Final   MCH 08/23/2022 30.8  26.0 - 34.0 pg Final   MCHC 08/23/2022 35.2  30.0 - 36.0 g/dL Final   RDW 08/23/2022 14.0  11.5 - 15.5 % Final   Platelets 08/23/2022 219  150 - 400 K/uL Final   nRBC 08/23/2022 0.0  0.0 - 0.2 % Final   Neutrophils Relative % 08/23/2022 58  % Final   Neutro Abs 08/23/2022 2.1  1.7 - 7.7 K/uL Final   Lymphocytes Relative 08/23/2022 27  % Final   Lymphs Abs 08/23/2022 1.0  0.7 - 4.0 K/uL Final   Monocytes Relative 08/23/2022 10  % Final   Monocytes Absolute 08/23/2022 0.3  0.1 - 1.0 K/uL Final   Eosinophils Relative 08/23/2022 4  % Final   Eosinophils Absolute 08/23/2022 0.1  0.0 - 0.5 K/uL Final   Basophils Relative 08/23/2022 1  % Final   Basophils Absolute 08/23/2022 0.0  0.0 - 0.1 K/uL Final   Immature Granulocytes 08/23/2022 0  % Final   Abs Immature Granulocytes 08/23/2022 0.01  0.00 - 0.07 K/uL Final   Performed at [facility], [place] [address] [place], [place] [address]   Copper 08/23/2022 106  69 - 132 ug/dL Final   Comment: (NOTE) This test was developed and its performance characteristics determined by [facility]. It has not been cleared or approved by the Food and Drug Administration.                                Detection Limit = 5 Performed At: [facility] [place], [place] [id number] Rush Farmer MD [id number]    Ferritin 08/23/2022 20 (L)  24 - 336 ng/mL Final   Performed at [facility], [place] [address] [place], [place] [address]   Folate 08/23/2022 12.4  >5.9 ng/mL Final   Performed at [facility], [place] [address], [place], [place] [address]   Zinc 08/23/2022 81  44 - 115 ug/dL Final   Comment: (NOTE) This test was developed and its performance characteristics determined by [facility]. It has not been cleared or approved by the Food and Drug Administration.                                 Detection Limit = 5  Performed At: [facility] [place], [place] [id number] Rush Farmer MD [id number]    Vitamin B-12 08/23/2022 607  180 - 914 pg/mL Final   Comment: (NOTE) This assay is not validated for testing neonatal or myeloproliferative syndrome specimens for Vitamin B12 levels. Performed at [facility], [place] [address], [place], [place] [address]    Retic Ct Pct 08/23/2022 1.8  0.4 - 3.1 % Final   RBC. 08/23/2022 4.66  4.22 - 5.81 MIL/uL Final   Retic Count, Absolute 08/23/2022 82.9  19.0 - 186.0 K/uL Final   Immature Retic Fract 08/23/2022 12.5  2.3 - 15.9 % Final   Performed at [facility], [place] [address], [place], [place] [address]   Kappa free light chain 08/23/2022 30.3 (H)  3.3 - 19.4 mg/L Final   Lambda free light chains 08/23/2022 16.3  5.7 - 26.3 mg/L Final   Kappa, lambda light chain ratio 08/23/2022 1.86 (H)  0.26 - 1.65 Final   Comment: (NOTE) Performed At: [facility] [place], [place] [id number] Rush Farmer MD [id number]    HIV Screen 4th Generation wRfx 08/23/2022 Non Reactive  Non Reactive Final   Performed at [facility], [place] [address], [place], [place] [address]   Rhuematoid fact SerPl-aCnc 08/23/2022 <10.0  <14.0 IU/mL Final   Comment: (NOTE) Performed At: [facility] [place], [place] [id number] Rush Farmer MD [id number]    Hepatitis B Surface Ag 08/23/2022 NON REACTIVE  NON REACTIVE Final   HCV Ab 08/23/2022 NON REACTIVE  NON REACTIVE Final   Comment: (NOTE) Nonreactive HCV antibody screen is consistent with no HCV infections,  unless recent infection is suspected or other evidence exists to indicate HCV infection.     Hep A IgM 08/23/2022 NON REACTIVE  NON REACTIVE Final   Hep B C IgM 08/23/2022 NON REACTIVE  NON REACTIVE Final   Performed at [facility], [place] [address] [place], [place] [address]     RADIOGRAPHIC STUDIES: I have personally reviewed the radiological images as listed and agree with the findings in the report  No results found.  ASSESSMENT/PLAN  52 year old male with history of prostate cancer noted to have mild granulocytopenia and lymphocytopenia  Lymphocytopenia Definition:  For adults ALC < 1000 Possible causes in this patient.   INFECTION Viral (eg, HIV, HTLV , hepatitis, )   Bacterial (eg, tuberculosis, typhoid fever, brucellosis, Erlichiosis)  Fungal (eg, histoplasmosis)  Parasitic (eg, malaria)   CONGENITAL IMMUNODEFICIENCY DISORDERS DiGeorge anomaly Wiskott-Aldrich syndrome Severe combined immunodeficiency syndrome Ataxia-telangiectasia.    SYSTEMIC DISEASE Autoimmune disorders (eg, systemic lupus erythematosus, rheumatoid arthritis, Sjgren syndrome)  Lymphoma (Hodgkin and Non Hodgkin) Other malignancies  Sarcoidosis   Cushing's syndrome Diabetes   OTHER CAUSES Alcohol abuse  Zinc deficiency  Malnutrition, stress, exercise, trauma  Protein-losing enteropathy  Idiopathic CD4+ lymphocytopenia   Initial Evaluation:  CBC with diff, CMP, LDHBlood smear.  SPEP with IEP, free light chains, serum immunoglobulin levels including IgE /HIV, Hepatitis serologies,  angiotensin level, Cortisol, ANA/ ds DNA/ RF/ ANCA/ Sjogrens.  Zinc Level.  Will consider ACE level and lymphocyte subsets depending on results of these investigations  Granulocytopenia:   Possible causes in this patient  Neutropenia Causes Causes Examples   Congentital Benign Ethnic Pure white cell aplasia Storage Disease ELA2 mutation ELANE related neutropenia Kostman  Shawachman-Diamond Syndrome Barth Syndrome Severe congenital neutropenia X-linked agammaglobinemia Storage diseases GATA2 deficiency   Infectious  Viral Hepatitis A, B, C CMV, EBV, HIV Parvovirus   Bacterial  Tuberculosis Burcellosis [place] Typhoid   Rikettsial [place] Spotted  fever Erlichiosis   Fungal Histoplasmosis  Autoimmune Primary Autoimmune    Thymoma SLE, RA Felty's syndrome Crohn's Disease Evans Syndrome   Nutritional Deficiencies B12 Folate Copper Protein calorie malnutrition   Hematologic Malignancies T-cell LGL Myeloid neoplasms PNH   Medications Non-chemotherapy   Bone marrow failure Aplastic anemia PNH    Initial evaluation:  CBC with diff, CMP, Morphology smear, ANA, anti-ds DNA, RF, B12, folate, copper, ESR.  Hepatitis ABC, HIV.  Consider flow for lymphoma including LGL leukemia.  Quantiferon Gold.  PCR for Parvo-B19, Serologies for Brucellosis, RMSF, Erlichiosis, Histoplasmosis, Typhus, Histoplasmosis.     History of prostate cancer:  Treated with prostatectomy.  Obtain PSA.  Refer to genetics because he developed it at a young age.      Cancer Staging  No matching staging information was found for the patient.   No problem-specific Assessment & Plan notes found for this encounter.   No orders of the defined types were placed in this encounter.   All questions were answered. The patient knows to call the clinic with any problems, questions or concerns.  This note was electronically signed.    Barbee Cough, MD  09/20/2022 8:31 AM

## 2022-09-21 ENCOUNTER — Telehealth: Payer: Self-pay | Admitting: Oncology

## 2022-09-21 NOTE — Telephone Encounter (Signed)
Spoke with patient confirming upcoming appointments  

## 2022-09-25 DIAGNOSIS — K635 Polyp of colon: Secondary | ICD-10-CM | POA: Insufficient documentation

## 2022-09-25 DIAGNOSIS — D72810 Lymphocytopenia: Secondary | ICD-10-CM | POA: Insufficient documentation

## 2022-09-25 DIAGNOSIS — D709 Neutropenia, unspecified: Secondary | ICD-10-CM | POA: Insufficient documentation

## 2022-09-25 LAB — MULTIPLE MYELOMA PANEL, SERUM
Albumin SerPl Elph-Mcnc: 4 g/dL (ref 2.9–4.4)
Albumin/Glob SerPl: 1.2 (ref 0.7–1.7)
Alpha 1: 0.2 g/dL (ref 0.0–0.4)
Alpha2 Glob SerPl Elph-Mcnc: 0.7 g/dL (ref 0.4–1.0)
B-Globulin SerPl Elph-Mcnc: 1.1 g/dL (ref 0.7–1.3)
Gamma Glob SerPl Elph-Mcnc: 1.5 g/dL (ref 0.4–1.8)
Globulin, Total: 3.5 g/dL (ref 2.2–3.9)
IgA: 310 mg/dL (ref 90–386)
IgG (Immunoglobin G), Serum: 1577 mg/dL (ref 603–1613)
IgM (Immunoglobulin M), Srm: 82 mg/dL (ref 20–172)
Total Protein ELP: 7.5 g/dL (ref 6.0–8.5)

## 2022-11-05 ENCOUNTER — Inpatient Hospital Stay: Payer: BC Managed Care – PPO | Attending: Oncology | Admitting: Genetic Counselor

## 2022-11-05 ENCOUNTER — Other Ambulatory Visit: Payer: Self-pay | Admitting: Genetic Counselor

## 2022-11-05 ENCOUNTER — Encounter: Payer: Self-pay | Admitting: Genetic Counselor

## 2022-11-05 ENCOUNTER — Other Ambulatory Visit: Payer: Self-pay

## 2022-11-05 ENCOUNTER — Inpatient Hospital Stay: Payer: BC Managed Care – PPO

## 2022-11-05 DIAGNOSIS — C61 Malignant neoplasm of prostate: Secondary | ICD-10-CM | POA: Diagnosis not present

## 2022-11-05 DIAGNOSIS — Z1379 Encounter for other screening for genetic and chromosomal anomalies: Secondary | ICD-10-CM

## 2022-11-05 DIAGNOSIS — Z803 Family history of malignant neoplasm of breast: Secondary | ICD-10-CM | POA: Insufficient documentation

## 2022-11-05 LAB — GENETIC SCREENING ORDER

## 2022-11-05 NOTE — Progress Notes (Signed)
REFERRING PROVIDER: Barbee Cough, MD   PRIMARY PROVIDER:  Default, Provider, MD  PRIMARY REASON FOR VISIT:  1. Prostate cancer ([place])   2. Family history of breast cancer     HISTORY OF PRESENT ILLNESS:   Bradley Lowe, a 52 y.o. male, was seen for a [place] cancer genetics consultation at the request of Dr. Federico Flake due to a personal and family history of cancer.  Mr. Bradley Lowe presents to clinic today to discuss the possibility of a hereditary predisposition to cancer, to discuss genetic testing, and to further clarify his future cancer risks, as well as potential cancer risks for family members.   Mr. Bradley Lowe was diagnosed with prostate cancer at age 22 (Gleason score 7).  Past Medical History:  Diagnosis Date   Anxiety    Astigmatism    Hx of migraines    last was 5-8 months ago    Prostate cancer [facility])     Past Surgical History:  Procedure Laterality Date   FOREIGN BODY REMOVAL Right    pellet removed from right foot    HERNIA REPAIR     KNEE ARTHROSCOPY Right    PELVIC LYMPH NODE DISSECTION Bilateral 04/19/2019   Procedure: PELVIC LYMPH NODE DISSECTION;  Surgeon: Lucas Mallow, MD;  Location: [facility];  Service: Urology;  Laterality: Bilateral;   ROBOT ASSISTED LAPAROSCOPIC RADICAL PROSTATECTOMY N/A 04/19/2019   Procedure: XI ROBOTIC ASSISTED LAPAROSCOPIC RADICAL PROSTATECTOMY;  Surgeon: Lucas Mallow, MD;  Location: [facility];  Service: Urology;  Laterality: N/A;   VASECTOMY      Social History   Socioeconomic History   Marital status: Married    Spouse name: Not on file   Number of children: Not on file   Years of education: Not on file   Highest education level: Not on file  Occupational History   Not on file  Tobacco Use   Smoking status: Never   Smokeless tobacco: Never  Substance and Sexual Activity   Alcohol use: No   Drug use: No   Sexual activity: Not on file  Other Topics Concern   Not on file  Social History Narrative   Not on file    Social Determinants of Health   Financial Resource Strain: Not on file  Food Insecurity: Not on file  Transportation Needs: Not on file  Physical Activity: Not on file  Stress: Not on file  Social Connections: Not on file     FAMILY HISTORY:  We obtained a detailed, 4-generation family history.  Significant diagnoses are listed below: Family History  Problem Relation Age of Onset   Pancreatitis Mother 15       due to ETOH   Kidney disease Father 52   Alcohol abuse Sister    Diabetes Sister    Hypertension Sister    Other Brother        MVA   Breast cancer Maternal Aunt 79 - 44       metastatic   Lung cancer Maternal Aunt        smoked        Bradley Lowe had five maternal aunts. One aunt was diagnosed with breast cancer in her early 11s and died due to metastatic breast cancer at age 35. A second aunt was diagnosed with lung cancer, she smoked and is deceased. Bradley Lowe is unaware of previous family history of genetic testing for hereditary cancer risks. There is no reported Ashkenazi Jewish ancestry.  GENETIC COUNSELING ASSESSMENT: Mr.  Ducasse is a 52 y.o. male with a personal and family history of cancer which is somewhat suggestive of a hereditary predisposition to cancer given his young age at diagnosis. We, therefore, discussed and recommended the following at today's visit.   DISCUSSION: We discussed that 5 - 10% of cancer is hereditary, with most cases of prostate cancer associated with BRCA1/2.  There are other genes that can be associated with hereditary prostate cancer syndromes.  We discussed that testing is beneficial for several reasons including knowing how to follow individuals after completing their treatment, identifying whether potential treatment options would be beneficial, and understanding if other family members could be at risk for cancer and allowing them to undergo genetic testing.   We reviewed the characteristics, features and inheritance  patterns of hereditary cancer syndromes. We also discussed genetic testing, including the appropriate family members to test, the process of testing, insurance coverage and turn-around-time for results. We discussed the implications of a negative, positive, carrier and/or variant of uncertain significant result. We recommended Bradley Lowe pursue genetic testing for a panel that includes genes associated with prostate and breast cancer.   Bradley Lowe  was offered a common hereditary cancer panel (47 genes) and an expanded pan-cancer panel (70 genes). Bradley Lowe was informed of the benefits and limitations of each panel, including that expanded pan-cancer panels contain genes that do not have clear management guidelines at this point in time.  We also discussed that as the number of genes included on a panel increases, the chances of variants of uncertain significance increases. After considering the benefits and limitations of each gene panel, Bradley Lowe elected to have [facility] Multi-Cancer Panel.  The Multi-Cancer + RNA Panel offered by [facility] includes sequencing and/or deletion/duplication analysis of the following 70 genes:  AIP*, ALK, APC*, ATM*, AXIN2*, BAP1*, BARD1*, BLM*, BMPR1A*, BRCA1*, BRCA2*, BRIP1*, CDC73*, CDH1*, CDK4, CDKN1B*, CDKN2A, CHEK2*, CTNNA1*, DICER1*, EPCAM (del/dup only), EGFR, FH*, FLCN*, GREM1 (promoter dup only), HOXB13, KIT, LZTR1, MAX*, MBD4, MEN1*, MET, MITF, MLH1*, MSH2*, MSH3*, MSH6*, MUTYH*, NF1*, NF2*, NTHL1*, PALB2*, PDGFRA, PMS2*, POLD1*, POLE*, POT1*, PRKAR1A*, PTCH1*, PTEN*, RAD51C*, RAD51D*, RB1*, RET, SDHA* (sequencing only), SDHAF2*, SDHB*, SDHC*, SDHD*, SMAD4*, SMARCA4*, SMARCB1*, SMARCE1*, STK11*, SUFU*, TMEM127*, TP53*, TSC1*, TSC2*, VHL*. RNA analysis is performed for * genes.  Based on Bradley Lowe personal and family history of cancer, he meets medical criteria for genetic testing. Despite that he meets criteria, he may still have an out of pocket cost. We  discussed that if his out of pocket cost for testing is over $100, the laboratory will call and confirm whether he wants to proceed with testing.  If the out of pocket cost of testing is less than $100 he will be billed by the genetic testing laboratory.   PLAN: After considering the risks, benefits, and limitations, Mr. Umansky provided informed consent to pursue genetic testing and the blood sample was sent to [facility] for analysis of the Multi-Cancer Panel. Results should be available within approximately 2-3 weeks' time, at which point they will be disclosed by telephone to Mr. Mancil, as will any additional recommendations warranted by these results. Mr. Tambasco will receive a summary of his genetic counseling visit and a copy of his results once available. This information will also be available in Epic.   Mr. Kissam questions were answered to his satisfaction today. Our contact information was provided should additional questions or concerns arise. Thank you for the referral and allowing [place] to share in the care of  your patient.   Lucille Passy, MS, [facility] Genetic Counselor [email or web address] (P) [phone]  The patient was seen for a total of 25 minutes in face-to-face genetic counseling. The patient was seen alone.  Drs. Lindi Adie and/or Burr Medico were available to discuss this case as needed.   _______________________________________________________________________ For Office Staff:  Number of people involved in session: 1 Was an Intern/ student involved with case: no

## 2022-11-18 ENCOUNTER — Encounter: Payer: Self-pay | Admitting: Genetic Counselor

## 2022-11-18 ENCOUNTER — Telehealth: Payer: Self-pay | Admitting: Genetic Counselor

## 2022-11-18 DIAGNOSIS — Z1379 Encounter for other screening for genetic and chromosomal anomalies: Secondary | ICD-10-CM | POA: Insufficient documentation

## 2022-11-18 NOTE — Telephone Encounter (Signed)
I contacted Bradley Lowe to discuss his genetic testing results. No pathogenic variants were identified in the 70 genes analyzed. Of note, a variant of uncertain significance was identified in the EGFR gene. Detailed clinic note to follow.  The test report has been scanned into EPIC and is located under the Molecular Pathology section of the Results Review tab.  A portion of the result report is included below for reference.   Lucille Passy, MS, [facility] Genetic Counselor [email or web address] (P) [phone]

## 2022-11-21 ENCOUNTER — Encounter: Payer: Self-pay | Admitting: Genetic Counselor

## 2022-11-21 ENCOUNTER — Ambulatory Visit: Payer: Self-pay | Admitting: Genetic Counselor

## 2022-11-21 DIAGNOSIS — Z1379 Encounter for other screening for genetic and chromosomal anomalies: Secondary | ICD-10-CM

## 2022-11-21 NOTE — Progress Notes (Signed)
HPI:   Bradley Lowe was previously seen in the [facility] clinic due to a personal and family history of cancer and concerns regarding a hereditary predisposition to cancer. Please refer to our prior cancer genetics clinic note for more information regarding our discussion, assessment and recommendations, at the time. Bradley Lowe recent genetic test results were disclosed to him, as were recommendations warranted by these results. These results and recommendations are discussed in more detail below.  FAMILY HISTORY:  We obtained a detailed, 4-generation family history.  Significant diagnoses are listed below:      Family History  Problem Relation Age of Onset   Pancreatitis Mother 72        due to ETOH   Kidney disease Father 13   Alcohol abuse Sister     Diabetes Sister     Hypertension Sister     Other Brother          MVA   Breast cancer Maternal Aunt 17 - 44        metastatic   Lung cancer Maternal Aunt          smoked             Bradley Lowe had five maternal aunts. One aunt was diagnosed with breast cancer in her early 17s and died due to metastatic breast cancer at age 26. A second aunt was diagnosed with lung cancer, she smoked and is deceased. Bradley Lowe is unaware of previous family history of genetic testing for hereditary cancer risks. There is no reported Ashkenazi Jewish ancestry.  GENETIC TEST RESULTS:  The [facility] Multi-Cancer Panel found no pathogenic mutations.   The Multi-Cancer + RNA Panel offered by [facility] includes sequencing and/or deletion/duplication analysis of the following 70 genes:  AIP*, ALK, APC*, ATM*, AXIN2*, BAP1*, BARD1*, BLM*, BMPR1A*, BRCA1*, BRCA2*, BRIP1*, CDC73*, CDH1*, CDK4, CDKN1B*, CDKN2A, CHEK2*, CTNNA1*, DICER1*, EPCAM (del/dup only), EGFR, FH*, FLCN*, GREM1 (promoter dup only), HOXB13, KIT, LZTR1, MAX*, MBD4, MEN1*, MET, MITF, MLH1*, MSH2*, MSH3*, MSH6*, MUTYH*, NF1*, NF2*, NTHL1*, PALB2*, PDGFRA, PMS2*, POLD1*, POLE*,  POT1*, PRKAR1A*, PTCH1*, PTEN*, RAD51C*, RAD51D*, RB1*, RET, SDHA* (sequencing only), SDHAF2*, SDHB*, SDHC*, SDHD*, SMAD4*, SMARCA4*, SMARCB1*, SMARCE1*, STK11*, SUFU*, TMEM127*, TP53*, TSC1*, TSC2*, VHL*. RNA analysis is performed for * genes.   The test report has been scanned into EPIC and is located under the Molecular Pathology section of the Results Review tab.  A portion of the result report is included below for reference. Genetic testing reported out on 11/11/2022.       Genetic testing identified a variant of uncertain significance (VUS) in the EGFR gene called [id number].  At this time, it is unknown if this variant is associated with an increased risk for cancer or if it is benign, but most uncertain variants are reclassified to benign. It should not be used to make medical management decisions. With time, we suspect the laboratory will determine the significance of this variant, if any. If the laboratory reclassifies this variant, we will attempt to contact Bradley Lowe to discuss it further.   Even though a pathogenic variant was not identified, possible explanations for the cancer in the family may include: There may be no hereditary risk for cancer in the family. The cancers in Bradley Lowe and/or his family may be due to other genetic or environmental factors. There may be a gene mutation in one of these genes that current testing methods cannot detect, but that chance is small. There could be another gene  that has not yet been discovered, or that we have not yet tested, that is responsible for the cancer diagnoses in the family.   Therefore, it is important to remain in touch with cancer genetics in the future so that we can continue to offer Bradley Lowe the most up to date genetic testing.   ADDITIONAL GENETIC TESTING:  We discussed with Bradley Lowe that his genetic testing was fairly extensive.  If there are genes identified to increase cancer risk that can be analyzed in the  future, we would be happy to discuss and coordinate this testing at that time.    CANCER SCREENING RECOMMENDATIONS:  Bradley Lowe test result is considered negative (normal).  This means that we have not identified a hereditary cause for his personal and family history of cancer at this time. Most cancers happen by chance and this negative test suggests that his cancer may fall into this category.    An individual's cancer risk and medical management are not determined by genetic test results alone. Overall cancer risk assessment incorporates additional factors, including personal medical history, family history, and any available genetic information that may result in a personalized plan for cancer prevention and surveillance. Therefore, it is recommended he continue to follow the cancer management and screening guidelines provided by his oncology and primary healthcare provider.  RECOMMENDATIONS FOR FAMILY MEMBERS:   Since he did not inherit a mutation in a cancer predisposition gene included on this panel, his children could not have inherited a mutation from him in one of these genes. Bradley Lowe son is recommended to begin prostate cancer screening at age 43 (47 years prior to his age at diagnosis). We do not recommend familial testing for the EGFR variant of uncertain significance (VUS).  FOLLOW-UP:  Cancer genetics is a rapidly advancing field and it is possible that new genetic tests will be appropriate for him and/or his family members in the future. We encouraged him to remain in contact with cancer genetics on an annual basis so we can update his personal and family histories and let him know of advances in cancer genetics that may benefit this family.   Our contact number was provided. Bradley Lowe questions were answered to his satisfaction, and he knows he is welcome to call us at anytime with additional questions or concerns.   Lucille Passy, MS, [facility] Genetic  Counselor [email or web address] (P) [phone]

## 2022-12-17 ENCOUNTER — Telehealth: Payer: Self-pay | Admitting: Oncology

## 2022-12-17 NOTE — Telephone Encounter (Signed)
Rescheduled 04/05 appointment times, patient has been called and voicemail was left.

## 2022-12-20 ENCOUNTER — Telehealth: Payer: Self-pay | Admitting: Oncology

## 2022-12-20 ENCOUNTER — Inpatient Hospital Stay: Payer: BC Managed Care – PPO | Attending: Oncology | Admitting: Oncology

## 2022-12-20 ENCOUNTER — Inpatient Hospital Stay: Payer: BC Managed Care – PPO | Admitting: Oncology

## 2022-12-20 DIAGNOSIS — D709 Neutropenia, unspecified: Secondary | ICD-10-CM | POA: Diagnosis not present

## 2022-12-20 DIAGNOSIS — Z8546 Personal history of malignant neoplasm of prostate: Secondary | ICD-10-CM | POA: Diagnosis not present

## 2022-12-20 DIAGNOSIS — D126 Benign neoplasm of colon, unspecified: Secondary | ICD-10-CM

## 2022-12-20 DIAGNOSIS — Z803 Family history of malignant neoplasm of breast: Secondary | ICD-10-CM

## 2022-12-20 DIAGNOSIS — D72810 Lymphocytopenia: Secondary | ICD-10-CM | POA: Diagnosis present

## 2022-12-20 DIAGNOSIS — Z1379 Encounter for other screening for genetic and chromosomal anomalies: Secondary | ICD-10-CM | POA: Diagnosis not present

## 2022-12-20 NOTE — Progress Notes (Signed)
[facility] Cancer Follow up Visit:  Patient Care Team: Default, Provider, MD as PCP - General Bradley Lowe, [name], MD as Consulting Physician (Hematology)  CHIEF COMPLAINTS/PURPOSE OF CONSULTATION:  HISTORY OF PRESENTING ILLNESS: Bradley Lowe 52 y.o. male is here because of leukopenia  Medical history notable for migraines, prostate cancer treated with robotic assisted laparoscopic prostatectomy with pelvic lymph node dissection in August 2020, restless leg syndrome  May 07 2022: WBC 2.8 hemoglobin 14.2 platelet count 203 61 segs 26 lymphs 9 monos 3 eos 1 basophil.  Absolute lymphocyte count 722  July 26, 2022: WBC 3.3 hemoglobin 14.0 MCV 88 platelet count 217 ANC 1729 ALC 1049 absolute monocyte count 310.  Differential 52 segs 32 lymphs 9 monos 5 eos 1 basophil smear for morphology review demonstrated leukopenia.  Myeloid population predominantly of mature segmented neutrophils.  Rare atypical lymphs.  Few target cells noted  August 23 2022:  [name] Hematology Consult Drinks a lot of sodas, teas, energy drinks.  Did not receive XRT of chemotherapy or or hormone suppression for treatment of prostate cancer  Social:  Divorced.  Billboard installer.  Tobacco none.  EtOH none.  Recreational drugs none  FMH Mother died 69 pancreatitis from EtOH Father died 48 ESRD on HD, tobacco use, PVD Brother x 2.  Identical twin died from MVA Sister x 2 alive.  Eldest with EtOH abuse. Other with DM Type II, HTN  WBC 3.6 hemoglobin 14.3 MCV 87 platelet count 219; 58 segs 27 lymphs 10 monos 4 eos 1 basophil. Ferritin 20 folate 12.4 Copper 106 B12 607 zinc 81  Serum free kappa 30.3 lambda 16.3 with a kappa lambda 1.86 ANA panel negative rheumatoid factor negative PSA undetectable Hepatitis ABC serologies negative HIV negative CMP normal  September 20 2022:  Scheduled follow up for leukopenia and iron deficiency.  Reviewed results of labs with patient.  Underwent colonoscopy  a few days ago and was found to have several polyps which were removed.  To see genetic counselor next month.  Asymptomatic with iron deficiency.    November 21 2022:  [employer], broadcasting/film/video.  [employer] gene study found no pathogenic mutations.   A VUS in EGFR (c.3572C>T) was noted.  At this time, it is unknown if this variant is associated with an increased risk for cancer or if it is benign   December 20 2022:  Scheduled follow up for leukopenia.  Telephone visit today.  Patient felt reassured by the Genetics visit.  He informed his son to begin prostate cancer screening at 38 years of age.  Daughter underwent genetic testing while in the army and she was OK.  He is taking oral iron about 5 times a week.  Informed him that he can take it every other day.  States that last PSA was negative.  To see PCP in August.  Will see in follow up in 6 months  Review of Systems  Constitutional:  Negative for appetite change, chills, fatigue, fever and unexpected weight change.  HENT:   Negative for mouth sores, nosebleeds, sore throat, trouble swallowing and voice change.   Eyes:  Negative for eye problems and icterus.       Vision changes:  None  Respiratory:  Negative for chest tightness, cough, hemoptysis, shortness of breath and wheezing.        PND:  none Orthopnea:  none DOE:    Cardiovascular:  Negative for chest pain, leg swelling and palpitations.       PND:  none Orthopnea:  none  Gastrointestinal:  Negative for abdominal distention, abdominal pain, blood in stool, constipation, diarrhea, nausea and vomiting.  Endocrine: Negative for hot flashes.       Cold intolerance:  none Heat intolerance:  none  Genitourinary:  Negative for bladder incontinence, difficulty urinating, dysuria, frequency, hematuria and nocturia.   Musculoskeletal:  Negative for arthralgias, back pain, gait problem, myalgias, neck pain and neck stiffness.  Skin:  Negative for itching, rash and wound.  Neurological:  Negative for  dizziness, extremity weakness, gait problem, headaches, light-headedness, numbness, seizures and speech difficulty.  Hematological:  Negative for adenopathy. Does not bruise/bleed easily.  Psychiatric/Behavioral:  Negative for sleep disturbance and suicidal ideas. The patient is not nervous/anxious.     MEDICAL HISTORY: Past Medical History:  Diagnosis Date   Anxiety    Astigmatism    Hx of migraines    last was 5-8 months ago    Prostate cancer (HCC)     SURGICAL HISTORY: Past Surgical History:  Procedure Laterality Date   FOREIGN BODY REMOVAL Right    pellet removed from right foot    HERNIA REPAIR     KNEE ARTHROSCOPY Right    PELVIC LYMPH NODE DISSECTION Bilateral 04/19/2019   Procedure: PELVIC LYMPH NODE DISSECTION;  Surgeon: Crista Elliot, MD;  Location: [facility];  Service: Urology;  Laterality: Bilateral;   ROBOT ASSISTED LAPAROSCOPIC RADICAL PROSTATECTOMY N/A 04/19/2019   Procedure: XI ROBOTIC ASSISTED LAPAROSCOPIC RADICAL PROSTATECTOMY;  Surgeon: Crista Elliot, MD;  Location: [facility];  Service: Urology;  Laterality: N/A;   VASECTOMY      SOCIAL HISTORY: Social History   Socioeconomic History   Marital status: Married    Spouse name: Not on file   Number of children: Not on file   Years of education: Not on file   Highest education level: Not on file  Occupational History   Not on file  Tobacco Use   Smoking status: Never   Smokeless tobacco: Never  Substance and Sexual Activity   Alcohol use: No   Drug use: No   Sexual activity: Not on file  Other Topics Concern   Not on file  Social History Narrative   Not on file   Social Determinants of Health   Financial Resource Strain: Not on file  Food Insecurity: Not on file  Transportation Needs: Not on file  Physical Activity: Not on file  Stress: Not on file  Social Connections: Not on file  Intimate Partner Violence: Not on file    FAMILY HISTORY Family History  Problem Relation Age of Onset    Pancreatitis Mother 83       due to ETOH   Kidney disease Father 3   Alcohol abuse Sister    Diabetes Sister    Hypertension Sister    Other Brother        MVA   Breast cancer Maternal Aunt 40 - 44       metastatic   Lung cancer Maternal Aunt        smoked    ALLERGIES:  has No Known Allergies.  MEDICATIONS:  Current Outpatient Medications  Medication Sig Dispense Refill   gabapentin (NEURONTIN) 100 MG capsule Take 100 mg by mouth daily as needed (pain). (Patient not taking: Reported on 08/23/2022)     HYDROcodone-acetaminophen (NORCO) 5-325 MG tablet Take 1-2 tablets by mouth every 6 (six) hours as needed for moderate pain or severe pain. (Patient not taking: Reported on 08/23/2022)  20 tablet 0   hydrOXYzine (ATARAX/VISTARIL) 10 MG tablet Take 10 mg by mouth daily as needed for anxiety. (Patient not taking: Reported on 08/23/2022)     sulfamethoxazole-trimethoprim (BACTRIM DS) 800-160 MG tablet Take 1 tablet by mouth 2 (two) times daily. Start the day prior to foley removal appointment (Patient not taking: Reported on 08/23/2022) 6 tablet 0   tadalafil (CIALIS) 5 MG tablet Take 5 mg by mouth daily as needed for erectile dysfunction.     No current facility-administered medications for this visit.    PHYSICAL EXAMINATION:  ECOG PERFORMANCE STATUS: 0 - Asymptomatic   There were no vitals filed for this visit.   There were no vitals filed for this visit.    Physical Exam Physical exam could not be conducted as part of a telephone visit LABORATORY DATA: I have personally reviewed the data as listed:  No visits with results within 1 Month(s) from this visit.  Latest known visit with results is:  Appointment on 11/05/2022  Component Date Value Ref Range Status   Genetic Screening Order 11/05/2022 Collected by Laboratory   Final   Performed at [facility], [address] [place], [place] [address]    RADIOGRAPHIC STUDIES: I have personally  reviewed the radiological images as listed and agree with the findings in the report  No results found.  ASSESSMENT/PLAN  52 year old male with history of prostate cancer noted to have mild granulocytopenia and lymphocytopenia  Lymphocytopenia Definition:  For adults ALC < 1000 Possible causes in this patient.   INFECTION Viral (HTLV  )   Bacterial (eg, tuberculosis, typhoid fever, brucellosis, Erlichiosis)  Fungal (eg, histoplasmosis)  Parasitic (eg, malaria)   CONGENITAL IMMUNODEFICIENCY DISORDERS DiGeorge anomaly Wiskott-Aldrich syndrome Severe combined immunodeficiency syndrome Ataxia-telangiectasia.    SYSTEMIC DISEASE Lymphoma (Hodgkin and Non Hodgkin) Other malignancies  Sarcoidosis  Cushing's syndrome Diabetes   OTHER CAUSES Stress, exercise,  Protein-losing enteropathy  Idiopathic CD4+ lymphocytopenia    His is mild and asymptomatic.  Will continue to follow.    Granulocytopenia:   Possible causes in this patient  Causes Examples   Congentital Benign Ethnic Storage Disease ELA2 mutation ELANE related neutropenia Kostman  Shawachman-Diamond Syndrome Barth Syndrome Severe congenital neutropenia Storage diseases GATA2 deficiency   Infectious  Viral CMV, EBV,  Parvovirus   Bacterial  Tuberculosis Burcellosis [place] Typhoid   Rikettsial [place] Spotted fever Erlichiosis   Fungal Histoplasmosis  Autoimmune Primary Autoimmune    Thymoma Felty's syndrome Crohn's Disease   Hematologic Malignancies T-cell LGL Myeloid neoplasms [facility]   Medications Non-chemotherapy   Bone marrow failure [facility]     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   December 2023:  PSA undetectable  Personal history of colon polyps and prostate cancer:  Referred to genetics because he developed it at a young age November 21 2022:  Genetics counselor [employer] gene study found no  pathogenic mutations.   A VUS in EGFR (c.3572C>T) was noted.  At this time, it is unknown if this variant is associated with an increased risk for cancer or if it is benign     Iron deficiency:  Will follow.  Can hold on iron supplementation at this time   Cancer Staging  No matching staging information was found for the patient.   No problem-specific Assessment & Plan notes found for this encounter.   No orders of the defined types were placed  in this encounter.  20 minutes was spent in patient care.  This included time spent preparing to see the patient (e.g., review of tests), obtaining and/or reviewing separately obtained history, counseling and educating the patient; documenting clinical information in the electronic or other health record, independently interpreting results and communicating results to the patient as well as coordination of care.      All questions were answered. The patient knows to call the clinic with any problems, questions or concerns.  This note was electronically signed.    Loni Muse, MD  12/20/2022 9:14 AM

## 2022-12-20 NOTE — Telephone Encounter (Signed)
Spoke with patient confirming upcoming appointment  

## 2023-06-16 ENCOUNTER — Telehealth: Payer: Self-pay

## 2023-06-16 NOTE — Telephone Encounter (Signed)
Rescheduled 10/04 appointmen to 10/03 due to provider change, patient has been called and voicemail was left.

## 2023-06-17 NOTE — Progress Notes (Unsigned)
Patient Care Team: Default, Provider, MD as PCP - General Bradley Muse, MD as Consulting Physician (Hematology)  Clinic Day:  06/19/2023  Referring physician: No ref. provider found  ASSESSMENT & PLAN:   Assessment & Plan: 52 y.o.male with history of migraines, prostate cancer treated with robotic assisted laparoscopic prostatectomy with pelvic lymph node dissection in August 2020, restless leg syndrome being follow up for leukopenia.  No problem-specific Assessment & Plan notes found for this encounter.   Leukopenia Normal in 2020.  Mildly below normal in 2023 and 2024.  Normal differentials Normal B12, folate, copper, LFT, renal function, thyroid function Testing negative for monoclonal protein, rheumatoid factor, HIV  Iron deficiency Start ferrous sulfate 325 mg daily x 4 months. [facility] Repeat ferritin, CBC in Feb and follow up with me  History of prostate cancer, pT2N0cM0 3+4=7 (GG2) Prostatectomy in 04/2019 PSA less than 0.1 in 08/2022 [employer] gene study found no pathogenic mutations.   The patient understands the plans discussed today and is in agreement with them.  He knows to contact our office if he develops concerns prior to his next appointment.  Follow up as needed, if new concerning findings from PCP or other providers.  Melven Sartorius, MD  [facility] [address] [place] [address] Dept: [phone] Dept Fax: [phone]   No orders of the defined types were placed in this encounter.   CHIEF COMPLAINT:  CC: leukopenia    INTERVAL HISTORY:  Bradley Lowe is here today for repeat clinical assessment. He denies fevers or chills. He denies pain. His appetite is good.  Report colonoscopy found 3 polyps. No bloody stool. No family history of colon cancer.  He sees Dr. Alvester Morin yearly and PSA was fine last month.  I have reviewed the past medical history, past  surgical history, social history and family history with the patient and they are unchanged from previous note.  No family history of prostate cancer.  No fevers, chills or abnormal weight loss, loss of appetite, frequent infection, joint pain, rash, abdominal pain, bloody stool or dark stool.  He does not know if there is family history of low white blood cell counts.  No medication or drug.  He just started to eat more fruits and vegetables. He is not a vegetarian.  No history of hepatitis, HIV.  He sees ortho for h/o right shoulder injury.  Past Medical History:  Diagnosis Date   Anxiety    Astigmatism    Hx of migraines    last was 5-8 months ago    Prostate cancer [facility])    Past Surgical History:  Procedure Laterality Date   FOREIGN BODY REMOVAL Right    pellet removed from right foot    HERNIA REPAIR     KNEE ARTHROSCOPY Right    PELVIC LYMPH NODE DISSECTION Bilateral 04/19/2019   Procedure: PELVIC LYMPH NODE DISSECTION;  Surgeon: Crista Elliot, MD;  Location: [facility];  Service: Urology;  Laterality: Bilateral;   ROBOT ASSISTED LAPAROSCOPIC RADICAL PROSTATECTOMY N/A 04/19/2019   Procedure: XI ROBOTIC ASSISTED LAPAROSCOPIC RADICAL PROSTATECTOMY;  Surgeon: Crista Elliot, MD;  Location: [facility];  Service: Urology;  Laterality: N/A;   VASECTOMY      ALLERGIES:  has No Known Allergies.  MEDICATIONS:  Current Outpatient Medications  Medication Sig Dispense Refill   ferrous sulfate 325 (65 FE) MG EC tablet Take 1 tablet (325 mg total)  by mouth daily. 120 tablet 0   rosuvastatin (CRESTOR) 10 MG tablet 1 tab(s) orally once a day for 90 days     tadalafil (CIALIS) 5 MG tablet Take 5 mg by mouth daily as needed for erectile dysfunction.     gabapentin (NEURONTIN) 100 MG capsule Take 100 mg by mouth daily as needed (pain). (Patient not taking: Reported on 08/23/2022)     HYDROcodone-acetaminophen (NORCO) 5-325 MG tablet Take 1-2 tablets by mouth every 6 (six) hours as needed  for moderate pain or severe pain. (Patient not taking: Reported on 08/23/2022) 20 tablet 0   hydrOXYzine (ATARAX/VISTARIL) 10 MG tablet Take 10 mg by mouth daily as needed for anxiety. (Patient not taking: Reported on 08/23/2022)     No current facility-administered medications for this visit.    HISTORY OF PRESENT ILLNESS:   Oncology History   No history exists.      REVIEW OF SYSTEMS:   As above  VITALS:  Blood pressure 128/75, pulse 60, temperature 97.9 F (36.6 C), resp. rate 20, height 6\' 4"  (1.93 m), weight 185 lb 14.4 oz (84.3 kg), SpO2 100%.  Wt Readings from Last 3 Encounters:  06/19/23 185 lb 14.4 oz (84.3 kg)  09/20/22 185 lb 12.8 oz (84.3 kg)  08/23/22 185 lb 3.2 oz (84 kg)    Body mass index is 22.63 kg/m.  Performance status (ECOG): 0 - Asymptomatic  PHYSICAL EXAM:   GENERAL:alert, no distress and comfortable SKIN: skin color, texture, turgor are normal, no rashes or significant lesions EYES: normal, Conjunctiva are pink and non-injected, sclera clear OROPHARYNX:no exudate, no erythema NECK: supple, non-tender, without nodularity LYMPH:  no palpable lymphadenopathy in the cervical LUNGS: clear to auscultation with normal breathing effort HEART: regular rate & rhythm ABDOMEN: abdomen soft, non-tender  LABORATORY DATA:  I have reviewed the data as listed    Component Value Date/Time   NA 142 06/19/2023 1049   K 4.0 06/19/2023 1049   CL 107 06/19/2023 1049   CO2 31 06/19/2023 1049   GLUCOSE 101 (H) 06/19/2023 1049   BUN 14 06/19/2023 1049   CREATININE 1.29 (H) 06/19/2023 1049   CALCIUM 9.6 06/19/2023 1049   PROT 7.4 06/19/2023 1049   ALBUMIN 4.3 06/19/2023 1049   AST 22 06/19/2023 1049   ALT 14 06/19/2023 1049   ALKPHOS 107 06/19/2023 1049   BILITOT 0.5 06/19/2023 1049   GFRNONAA >60 06/19/2023 1049   GFRAA >60 04/20/2019 0346    No results found for: "SPEP", "UPEP"  Lab Results  Component Value Date   WBC 3.3 (L) 06/19/2023   NEUTROABS  2.0 06/19/2023   HGB 13.8 06/19/2023   HCT 40.6 06/19/2023   MCV 88.3 06/19/2023   PLT 200 06/19/2023      Chemistry      Component Value Date/Time   NA 142 06/19/2023 1049   K 4.0 06/19/2023 1049   CL 107 06/19/2023 1049   CO2 31 06/19/2023 1049   BUN 14 06/19/2023 1049   CREATININE 1.29 (H) 06/19/2023 1049      Component Value Date/Time   CALCIUM 9.6 06/19/2023 1049   ALKPHOS 107 06/19/2023 1049   AST 22 06/19/2023 1049   ALT 14 06/19/2023 1049   BILITOT 0.5 06/19/2023 1049       RADIOGRAPHIC STUDIES: I have personally reviewed the radiological images as listed and agreed with the findings in the report. No results found.

## 2023-06-18 ENCOUNTER — Other Ambulatory Visit: Payer: Self-pay

## 2023-06-18 DIAGNOSIS — D72819 Decreased white blood cell count, unspecified: Secondary | ICD-10-CM

## 2023-06-18 DIAGNOSIS — E611 Iron deficiency: Secondary | ICD-10-CM

## 2023-06-19 ENCOUNTER — Inpatient Hospital Stay: Payer: BC Managed Care – PPO

## 2023-06-19 VITALS — BP 128/75 | HR 60 | Temp 97.9°F | Resp 20 | Ht 76.0 in | Wt 185.9 lb

## 2023-06-19 DIAGNOSIS — E611 Iron deficiency: Secondary | ICD-10-CM

## 2023-06-19 DIAGNOSIS — D72819 Decreased white blood cell count, unspecified: Secondary | ICD-10-CM | POA: Insufficient documentation

## 2023-06-19 DIAGNOSIS — R790 Abnormal level of blood mineral: Secondary | ICD-10-CM

## 2023-06-19 LAB — CBC WITH DIFFERENTIAL (CANCER CENTER ONLY)
Abs Immature Granulocytes: 0.01 10*3/uL (ref 0.00–0.07)
Basophils Absolute: 0 10*3/uL (ref 0.0–0.1)
Basophils Relative: 1 %
Eosinophils Absolute: 0 10*3/uL (ref 0.0–0.5)
Eosinophils Relative: 1 %
HCT: 40.6 % (ref 39.0–52.0)
Hemoglobin: 13.8 g/dL (ref 13.0–17.0)
Immature Granulocytes: 0 %
Lymphocytes Relative: 27 %
Lymphs Abs: 0.9 10*3/uL (ref 0.7–4.0)
MCH: 30 pg (ref 26.0–34.0)
MCHC: 34 g/dL (ref 30.0–36.0)
MCV: 88.3 fL (ref 80.0–100.0)
Monocytes Absolute: 0.3 10*3/uL (ref 0.1–1.0)
Monocytes Relative: 9 %
Neutro Abs: 2 10*3/uL (ref 1.7–7.7)
Neutrophils Relative %: 62 %
Platelet Count: 200 10*3/uL (ref 150–400)
RBC: 4.6 MIL/uL (ref 4.22–5.81)
RDW: 14 % (ref 11.5–15.5)
WBC Count: 3.3 10*3/uL — ABNORMAL LOW (ref 4.0–10.5)
nRBC: 0 % (ref 0.0–0.2)

## 2023-06-19 LAB — CMP (CANCER CENTER ONLY)
ALT: 14 U/L (ref 0–44)
AST: 22 U/L (ref 15–41)
Albumin: 4.3 g/dL (ref 3.5–5.0)
Alkaline Phosphatase: 107 U/L (ref 38–126)
Anion gap: 4 — ABNORMAL LOW (ref 5–15)
BUN: 14 mg/dL (ref 6–20)
CO2: 31 mmol/L (ref 22–32)
Calcium: 9.6 mg/dL (ref 8.9–10.3)
Chloride: 107 mmol/L (ref 98–111)
Creatinine: 1.29 mg/dL — ABNORMAL HIGH (ref 0.61–1.24)
GFR, Estimated: >60 mL/min (ref >60)
Glucose, Bld: 101 mg/dL — ABNORMAL HIGH (ref 70–99)
Potassium: 4 mmol/L (ref 3.5–5.1)
Sodium: 142 mmol/L (ref 135–145)
Total Bilirubin: 0.5 mg/dL (ref 0.3–1.2)
Total Protein: 7.4 g/dL (ref 6.5–8.1)

## 2023-06-19 LAB — FERRITIN: Ferritin: 26 ng/mL (ref 24–336)

## 2023-06-19 LAB — FOLATE: Folate: 7 ng/mL (ref >5.9)

## 2023-06-19 LAB — VITAMIN B12: Vitamin B-12: 908 pg/mL (ref 180–914)

## 2023-06-19 MED ORDER — FERROUS SULFATE 325 (65 FE) MG PO TBEC: 325.0000 mg | DELAYED_RELEASE_TABLET | Freq: Every day | ORAL | 0 refills | Status: AC

## 2023-06-20 ENCOUNTER — Ambulatory Visit: Payer: BC Managed Care – PPO | Admitting: Oncology

## 2023-06-20 ENCOUNTER — Other Ambulatory Visit: Payer: BC Managed Care – PPO

## 2024-03-31 ENCOUNTER — Encounter: Payer: Self-pay | Admitting: Primary Care

## 2024-03-31 ENCOUNTER — Ambulatory Visit (INDEPENDENT_AMBULATORY_CARE_PROVIDER_SITE_OTHER): Admitting: Primary Care

## 2024-03-31 VITALS — BP 106/68 | HR 72 | Temp 98.1°F | Ht 76.0 in | Wt 190.0 lb

## 2024-03-31 DIAGNOSIS — G4700 Insomnia, unspecified: Secondary | ICD-10-CM

## 2024-03-31 DIAGNOSIS — F419 Anxiety disorder, unspecified: Secondary | ICD-10-CM

## 2024-03-31 DIAGNOSIS — R0681 Apnea, not elsewhere classified: Secondary | ICD-10-CM

## 2024-03-31 MED ORDER — HYDROXYZINE PAMOATE 25 MG PO CAPS: 25.0000 mg | ORAL_CAPSULE | Freq: Three times a day (TID) | ORAL | 0 refills | Status: AC | PRN

## 2024-03-31 NOTE — Progress Notes (Signed)
 @Patient  ID: Bradley Lowe, male    DOB: 1970/11/17, 53 y.o.   MRN: [id number]  Chief Complaint  Patient presents with   Consult    Sleep consult Has never had a sleep study before.     Referring provider: O'Leary-Peck, Tawni*  HPI: 53 year old male, never smoked. PMH significant for prostate cancer, anemia, colon polyps, hyperlipidemia, anxiety.  03/31/2024 Discussed the use of AI scribe software for clinical note transcription with the patient, who gave verbal consent to proceed.  History of Present Illness   Bradley Lowe is a 53 year old male with anxiety who presents with insomnia.  He experiences insomnia characterized by difficulty falling asleep and maintaining sleep. His typical bedtime is between 10:30 and 11:30 PM, but it takes him about an hour to fall asleep. He usually sleeps for two to three hours before waking up around 2 or 3 AM and is unable to return to sleep, resulting in only two to three hours of sleep per night. He has not undergone any previous sleep studies.  He has a family history of sleep apnea and follows with psychiatry for anxiety. His current medications include Prozac, which was recently increased from 10 mg to 20 mg, and Atarax  (hydroxyzine ) 10 mg taken three times a day as needed for anxiety, primarily around 7:30 PM. He does not report any snoring symptoms but has been told by a previous partner that he stopped breathing while sleeping.  His weight has fluctuated by 20 pounds over the last two years, with a recent loss of 10 pounds. He typically sleeps on his stomach. His EPWORTH score is 7 out of 24. No concern for narcolepsy, cataplexy or sleep walking.      No Known Allergies  Immunization History  Administered Date(s) Administered   Tdap 09/15/2013    Past Medical History:  Diagnosis Date   Anxiety    Astigmatism    Hx of migraines    last was 5-8 months ago    Prostate cancer (HCC)     Tobacco History: Social History    Tobacco Use  Smoking Status Never  Smokeless Tobacco Never   Counseling given: Not Answered   Outpatient Medications Prior to Visit  Medication Sig Dispense Refill   ferrous sulfate  325 (65 FE) MG EC tablet Take 1 tablet (325 mg total) by mouth daily. 120 tablet 0   gabapentin  (NEURONTIN ) 100 MG capsule Take 100 mg by mouth daily as needed (pain).     HYDROcodone -acetaminophen  (NORCO) 5-325 MG tablet Take 1-2 tablets by mouth every 6 (six) hours as needed for moderate pain or severe pain. 20 tablet 0   hydrOXYzine  (ATARAX /VISTARIL ) 10 MG tablet Take 10 mg by mouth daily as needed for anxiety.     rosuvastatin (CRESTOR) 10 MG tablet 1 tab(s) orally once a day for 90 days     tadalafil (CIALIS) 5 MG tablet Take 5 mg by mouth daily as needed for erectile dysfunction.     No facility-administered medications prior to visit.      Review of Systems  Review of Systems  Constitutional:  Positive for fatigue.  HENT: Negative.    Respiratory:  Positive for apnea.   Cardiovascular: Negative.   Psychiatric/Behavioral:  Positive for sleep disturbance.    Physical Exam  BP 106/68 (BP Location: Left Arm, Patient Position: Sitting, Cuff Size: Normal)   Pulse 72   Temp 98.1 F (36.7 C) (Oral)   Ht 6' 4 (1.93 m)   Wt 190  lb (86.2 kg)   SpO2 99%   BMI 23.13 kg/m  Physical Exam Constitutional:      Appearance: Normal appearance.  HENT:     Head: Normocephalic and atraumatic.     Mouth/Throat:     Comments: Mallampati class I Cardiovascular:     Rate and Rhythm: Normal rate and regular rhythm.  Pulmonary:     Effort: Pulmonary effort is normal.     Breath sounds: Normal breath sounds.  Musculoskeletal:        General: Normal range of motion.  Skin:    General: Skin is warm and dry.  Neurological:     General: No focal deficit present.     Mental Status: He is alert and oriented to person, place, and time. Mental status is at baseline.  Psychiatric:        Mood and Affect:  Mood normal.        Behavior: Behavior normal.        Thought Content: Thought content normal.        Judgment: Judgment normal.      Lab Results:  CBC    Component Value Date/Time   WBC 3.3 (L) 06/19/2023 1049   WBC 3.6 (L) 08/23/2022 1449   RBC 4.60 06/19/2023 1049   HGB 13.8 06/19/2023 1049   HCT 40.6 06/19/2023 1049   PLT 200 06/19/2023 1049   MCV 88.3 06/19/2023 1049   MCH 30.0 06/19/2023 1049   MCHC 34.0 06/19/2023 1049   RDW 14.0 06/19/2023 1049   LYMPHSABS 0.9 06/19/2023 1049   MONOABS 0.3 06/19/2023 1049   EOSABS 0.0 06/19/2023 1049   BASOSABS 0.0 06/19/2023 1049    BMET    Component Value Date/Time   NA 142 06/19/2023 1049   K 4.0 06/19/2023 1049   CL 107 06/19/2023 1049   CO2 31 06/19/2023 1049   GLUCOSE 101 (H) 06/19/2023 1049   BUN 14 06/19/2023 1049   CREATININE 1.29 (H) 06/19/2023 1049   CALCIUM 9.6 06/19/2023 1049   GFRNONAA >60 06/19/2023 1049   GFRAA >60 04/20/2019 0346    BNP No results found for: BNP  ProBNP No results found for: PROBNP  Imaging: No results found.   Assessment & Plan:   1. Witnessed episode of apnea (Primary) - Home sleep test; Future  2. Insomnia, unspecified type  Assessment and Plan    Witnessed apnea Patient experiencing insomnia symptoms, sleep disruption and witnessed apnea. Sleep apnea considered as a differential diagnosis for sleep disruption. No previous sleep studies. Epworth score of 7/24 indicates mild to moderate daytime sleepiness. No reported snoring, but previous partner noted episodes of apnea during sleep. Discussed potential treatment options including weight loss, oral appliance, CPAP therapy, or ENT referral for surgical options. Sleep apnea increases risk for cardiac arrhythmias, stroke, pulmonary hypertension, and diabetes. - Order home sleep study to rule out sleep apnea. - Advise side sleeping position and avoidance of alcohol before bedtime. - Instruct not to drive if experiencing  excessive daytime sleepiness or fatigue.  Insomnia Chronic insomnia with difficulty initiating and maintaining sleep, frequent awakenings at 2-3 AM, achieving only 2-3 hours of sleep per night. No prior sleep study. Family history of sleep apnea. Current medications include Atarax  10 mg TID PRN for anxiety, mainly in the evening, and Prozac recently increased from 10 mg to 20 mg. - Increase Atarax  to 25 mg TID for anxiety. Patient can take 25-50mg  one hour prior to bed for insomnia - Recommend follow-up with psychiatry for  further management of insomnia. - Consider Sonata or Rozerem as potential sleep aids if increased Atarax  dose is ineffective.  Anxiety Anxiety managed with Prozac and Atarax . Prozac dose recently increased to 20 mg. Atarax  currently taken 10 mg TID PRN, mainly in the evening. Anxiety may contribute to insomnia. - Increase Atarax  to 25 mg TID for anxiety. - Follow up with psychiatry for further management of anxiety.    Almarie LELON Ferrari, NP 03/31/2024

## 2024-03-31 NOTE — Patient Instructions (Addendum)
  VISIT SUMMARY: During your visit, we discussed your ongoing issues with insomnia and anxiety. You shared that you have difficulty falling asleep and staying asleep, often waking up around 2 or 3 AM and unable to return to sleep. We also reviewed your current medications and family history of sleep apnea.  YOUR PLAN: -INSOMNIA: Insomnia is a condition where you have trouble falling asleep or staying asleep. We will increase your Atarax  dose to 25 mg three times a day and recommend taking 25-50 mg one hour before bedtime to help you sleep. Please follow up with your psychiatrist for further management, and we may consider other sleep aids if needed. Potentially consider sonata or rozerem.   -ANXIETY: Anxiety is a feeling of worry or fear that can affect your daily life. Your Prozac dose was recently increased to 20 mg, and we will also increase your Atarax  dose to 25 mg three times a day to help manage your anxiety. Please follow up with your psychiatrist for further management.  -RULE OUT SLEEP APNEA: Sleep apnea is a condition where your breathing stops and starts during sleep, which can lead to serious health issues. We will order a home sleep study to check for sleep apnea. In the meantime, try sleeping on your side and avoid alcohol before bedtime. Do not drive if you feel excessively sleepy during the day.  INSTRUCTIONS: Please follow up with your psychiatrist for further management of your insomnia and anxiety. We will also schedule a home sleep study to rule out sleep apnea. If you experience excessive daytime sleepiness or fatigue, avoid driving.  Follow-up 8=12 weeks with [facility] np

## 2024-05-19 ENCOUNTER — Encounter: Payer: Self-pay | Admitting: Internal Medicine

## 2024-06-23 ENCOUNTER — Encounter: Payer: Self-pay | Admitting: Primary Care

## 2024-06-23 ENCOUNTER — Ambulatory Visit: Admitting: Primary Care

## 2024-06-23 VITALS — BP 124/62 | HR 71 | Temp 97.8°F | Ht 76.0 in | Wt 193.2 lb

## 2024-06-23 DIAGNOSIS — G4700 Insomnia, unspecified: Secondary | ICD-10-CM

## 2024-06-23 NOTE — Progress Notes (Signed)
 @Patient  ID: Bradley Lowe, male    DOB: 1971/03/14, 53 y.o.   MRN: [id number]  No chief complaint on file.   Referring provider: No ref. provider found  HPI: 53 year old male, never smoked. PMH significant for prostate cancer, anemia, colon polyps, hyperlipidemia, anxiety.  Previous [facility] pulmonary encounter  03/31/2024 Discussed the use of AI scribe software for clinical note transcription with the patient, who gave verbal consent to proceed.  History of Present Illness   Bradley Lowe is a 53 year old male with anxiety who presents with insomnia.  He experiences insomnia characterized by difficulty falling asleep and maintaining sleep. His typical bedtime is between 10:30 and 11:30 PM, but it takes him about an hour to fall asleep. He usually sleeps for two to three hours before waking up around 2 or 3 AM and is unable to return to sleep, resulting in only two to three hours of sleep per night. He has not undergone any previous sleep studies.  He has a family history of sleep apnea and follows with psychiatry for anxiety. His current medications include Prozac, which was recently increased from 10 mg to 20 mg, and Atarax  (hydroxyzine ) 10 mg taken three times a day as needed for anxiety, primarily around 7:30 PM. He does not report any snoring symptoms but has been told by a previous partner that he stopped breathing while sleeping.  His weight has fluctuated by 20 pounds over the last two years, with a recent loss of 10 pounds. He typically sleeps on his stomach. His EPWORTH score is 7 out of 24. No concern for narcolepsy, cataplexy or sleep walking.     06/23/2024- Interim hx  Discussed the use of AI scribe software for clinical note transcription with the patient, who gave verbal consent to proceed.  History of Present Illness Bradley Lowe is a 53 year old male with anxiety and insomnia who presents with sleep disturbances.  He experiences significant sleep disturbances  characterized by difficulty falling and maintaining sleep. He typically goes to bed between 10:30 and 11:30 PM, but it can take up to an hour to fall asleep. He often wakes up around 2 or 3 AM and is unable to return to sleep, resulting in only about three hours of sleep per night. He feels groggy upon waking, with energy levels improving around 8 or 9 AM.  He has a history of anxiety for which he is under psychiatric care and is currently taking Prozac, with the dose recently increased to 20 mg. He was also prescribed a new sleep aid less than a month ago, which he takes as needed, but he cannot recall the name. Despite taking the medication an hour before bedtime, he continues to wake up during the night. He has tried various over-the-counter sleep aids, but these have not resolved his sleep issues.  A recent sleep study was negative for obstructive sleep apnea and showed normal levels respiratory breathing, AHI 3/hour with SpO2 low 91%. No disruptive snoring. His sleep environment is kept quiet and dark to aid sleep. He attempts to wind down in the evenings by turning off screens and creating a conducive sleep environment.  No Known Allergies  Immunization History  Administered Date(s) Administered   Tdap 09/15/2013    Past Medical History:  Diagnosis Date   Anxiety    Astigmatism    Hx of migraines    last was 5-8 months ago    Prostate cancer (HCC)     Tobacco History:  Social History   Tobacco Use  Smoking Status Never  Smokeless Tobacco Never   Counseling given: Not Answered   Outpatient Medications Prior to Visit  Medication Sig Dispense Refill   ferrous sulfate  325 (65 FE) MG EC tablet Take 1 tablet (325 mg total) by mouth daily. 120 tablet 0   FLUoxetine (PROZAC) 20 MG capsule Take 20 mg by mouth daily.     hydrOXYzine  (VISTARIL ) 25 MG capsule Take 1 capsule (25 mg total) by mouth 3 (three) times daily as needed for anxiety (Insomnia). 90 capsule 0   rosuvastatin  (CRESTOR) 10 MG tablet 1 tab(s) orally once a day for 90 days     tadalafil (CIALIS) 5 MG tablet Take 5 mg by mouth daily as needed for erectile dysfunction.     No facility-administered medications prior to visit.   Review of Systems  Review of Systems  Constitutional: Negative.   Respiratory: Negative.    Psychiatric/Behavioral:  Positive for sleep disturbance.    Physical Exam  There were no vitals taken for this visit. Physical Exam Constitutional:      Appearance: Normal appearance. He is well-developed.  HENT:     Head: Normocephalic and atraumatic.     Mouth/Throat:     Mouth: Mucous membranes are moist.     Pharynx: Oropharynx is clear.  Cardiovascular:     Rate and Rhythm: Normal rate and regular rhythm.     Heart sounds: Normal heart sounds.  Pulmonary:     Effort: Pulmonary effort is normal. No respiratory distress.     Breath sounds: Normal breath sounds. No wheezing or rhonchi.  Musculoskeletal:        General: Normal range of motion.     Cervical back: Normal range of motion and neck supple.  Skin:    General: Skin is warm and dry.     Findings: No erythema or rash.  Neurological:     General: No focal deficit present.     Mental Status: He is alert and oriented to person, place, and time. Mental status is at baseline.  Psychiatric:        Mood and Affect: Mood normal.        Behavior: Behavior normal.        Thought Content: Thought content normal.        Judgment: Judgment normal.      Lab Results:  CBC    Component Value Date/Time   WBC 3.3 (L) 06/19/2023 1049   WBC 3.6 (L) 08/23/2022 1449   RBC 4.60 06/19/2023 1049   HGB 13.8 06/19/2023 1049   HCT 40.6 06/19/2023 1049   PLT 200 06/19/2023 1049   MCV 88.3 06/19/2023 1049   MCH 30.0 06/19/2023 1049   MCHC 34.0 06/19/2023 1049   RDW 14.0 06/19/2023 1049   LYMPHSABS 0.9 06/19/2023 1049   MONOABS 0.3 06/19/2023 1049   EOSABS 0.0 06/19/2023 1049   BASOSABS 0.0 06/19/2023 1049    BMET     Component Value Date/Time   NA 142 06/19/2023 1049   K 4.0 06/19/2023 1049   CL 107 06/19/2023 1049   CO2 31 06/19/2023 1049   GLUCOSE 101 (H) 06/19/2023 1049   BUN 14 06/19/2023 1049   CREATININE 1.29 (H) 06/19/2023 1049   CALCIUM 9.6 06/19/2023 1049   GFRNONAA >60 06/19/2023 1049   GFRAA >60 04/20/2019 0346    BNP No results found for: BNP  ProBNP No results found for: PROBNP  Imaging: No results found.  Assessment & Plan:   1. Insomnia, unspecified type (Primary)  Assessment & Plan Insomnia Insomnia with difficulty initiating and maintaining sleep. No diagnosis of obstructive sleep apnea based on sleep study results, although occasional apneic events were noted. Oxygen levels remained normal. Insomnia likely contributes to sleep disruption. Current management includes a sleep aid prescribed by psychiatry, with inconsistent effectiveness. No significant snoring or disruptive sleep apnea symptoms. Psychiatric management for anxiety with Prozac and a sleep aid is ongoing. - Continue current psychiatric management for insomnia. - Take sleep aid consistently around 9:30 PM to 10:00 PM. - Aim for at least 5-6 hours of sleep per night. - Consider using a wedge pillow to elevate the head and reduce apneic events. - Maintain sleep hygiene practices: cool, dark room, avoid screens before bed, and avoid eating 2 hours before bed. - If current sleep aid is ineffective, discuss alternative medications with psychiatrist. - Follow up with psychiatrist in November for further management of insomnia.    Almarie LELON Ferrari, NP 06/23/2024

## 2024-06-23 NOTE — Patient Instructions (Addendum)
 Home sleep study was negative for obstructive sleep apnea or sleep disordered breathing  VISIT SUMMARY: During your visit, we discussed your ongoing issues with sleep disturbances, including difficulty falling and staying asleep. We reviewed your current medications and recent sleep study results. We also talked about strategies to improve your sleep quality.  YOUR PLAN: -INSOMNIA: Insomnia is a condition where you have trouble falling asleep or staying asleep. We discussed that your sleep study did not show obstructive sleep apnea, but you did have occasional apneic events. Your oxygen levels were normal. You should continue your current psychiatric management, including taking your sleep aid consistently around 9:30 PM to 10:00 PM. Aim for at least 5-6 hours of sleep per night. Consider using a wedge pillow to elevate your head and reduce apneic events. Maintain good sleep hygiene practices, such as keeping your room cool and dark, avoiding screens before bed, and not eating 2 hours before bedtime. If your current sleep aid is not effective, discuss alternative medications with your psychiatrist.  INSTRUCTIONS: Please follow up with your psychiatrist in November for further management of your insomnia. Return is sleep problems worsen   Insomnia Insomnia is a sleep disorder that makes it difficult to fall asleep or stay asleep. Insomnia can cause fatigue, low energy, difficulty concentrating, mood swings, and poor performance at work or school. There are three different ways to classify insomnia: Difficulty falling asleep. Difficulty staying asleep. Waking up too early in the morning. Any type of insomnia can be long-term (chronic) or short-term (acute). Both are common. Short-term insomnia usually lasts for 3 months or less. Chronic insomnia occurs at least three times a week for longer than 3 months. What are the causes? Insomnia may be caused by another condition, situation, or substance,  such as: Having certain mental health conditions, such as anxiety and depression. Using caffeine, alcohol, tobacco, or drugs. Having gastrointestinal conditions, such as gastroesophageal reflux disease (GERD). Having certain medical conditions. These include: Asthma. Alzheimer's disease. Stroke. Chronic pain. An overactive thyroid gland (hyperthyroidism). Other sleep disorders, such as restless legs syndrome and sleep apnea. Menopause. Sometimes, the cause of insomnia may not be known. What increases the risk? Risk factors for insomnia include: Gender. Females are affected more often than males. Age. Insomnia is more common as people get older. Stress and certain medical and mental health conditions. Lack of exercise. Having an irregular work schedule. This may include working night shifts and traveling between different time zones. What are the signs or symptoms? If you have insomnia, the main symptom is having trouble falling asleep or having trouble staying asleep. This may lead to other symptoms, such as: Feeling tired or having low energy. Feeling nervous about going to sleep. Not feeling rested in the morning. Having trouble concentrating. Feeling irritable, anxious, or depressed. How is this diagnosed? This condition may be diagnosed based on: Your symptoms and medical history. Your health care provider may ask about: Your sleep habits. Any medical conditions you have. Your mental health. A physical exam. How is this treated? Treatment for insomnia depends on the cause. Treatment may focus on treating an underlying condition that is causing the insomnia. Treatment may also include: Medicines to help you sleep. Counseling or therapy. Lifestyle adjustments to help you sleep better. Follow these instructions at home: Eating and drinking  Limit or avoid alcohol, caffeinated beverages, and products that contain nicotine and tobacco, especially close to bedtime. These can  disrupt your sleep. Do not eat a large meal or eat spicy  foods right before bedtime. This can lead to digestive discomfort that can make it hard for you to sleep. Sleep habits  Keep a sleep diary to help you and your health care provider figure out what could be causing your insomnia. Write down: When you sleep. When you wake up during the night. How well you sleep and how rested you feel the next day. Any side effects of medicines you are taking. What you eat and drink. Make your bedroom a dark, comfortable place where it is easy to fall asleep. Put up shades or blackout curtains to block light from outside. Use a white noise machine to block noise. Keep the temperature cool. Limit screen use before bedtime. This includes: Not watching TV. Not using your smartphone, tablet, or computer. Stick to a routine that includes going to bed and waking up at the same times every day and night. This can help you fall asleep faster. Consider making a quiet activity, such as reading, part of your nighttime routine. Try to avoid taking naps during the day so that you sleep better at night. Get out of bed if you are still awake after 15 minutes of trying to sleep. Keep the lights down, but try reading or doing a quiet activity. When you feel sleepy, go back to bed. General instructions Take over-the-counter and prescription medicines only as told by your health care provider. Exercise regularly as told by your health care provider. However, avoid exercising in the hours right before bedtime. Use relaxation techniques to manage stress. Ask your health care provider to suggest some techniques that may work well for you. These may include: Breathing exercises. Routines to release muscle tension. Visualizing peaceful scenes. Make sure that you drive carefully. Do not drive if you feel very sleepy. Keep all follow-up visits. This is important. Contact a health care provider if: You are tired throughout  the day. You have trouble in your daily routine due to sleepiness. You continue to have sleep problems, or your sleep problems get worse. Get help right away if: You have thoughts about hurting yourself or someone else. Get help right away if you feel like you may hurt yourself or others, or have thoughts about taking your own life. Go to your nearest emergency room or: Call 911. Call the National Suicide Prevention Lifeline at 219-027-7462 or 988. This is open 24 hours a day. Text the Crisis Text Line at (402)520-1221. Summary Insomnia is a sleep disorder that makes it difficult to fall asleep or stay asleep. Insomnia can be long-term (chronic) or short-term (acute). Treatment for insomnia depends on the cause. Treatment may focus on treating an underlying condition that is causing the insomnia. Keep a sleep diary to help you and your health care provider figure out what could be causing your insomnia. This information is not intended to replace advice given to you by your health care provider. Make sure you discuss any questions you have with your health care provider. Document Revised: 08/13/2021 Document Reviewed: 08/13/2021 Elsevier Patient Education  2024 [employer].
